# Patient Record
Sex: Female | Born: 1980
Health system: Southern US, Community
[De-identification: ages and names within clinical notes are randomized; demographics above are authoritative.]

## PROBLEM LIST (undated history)

## (undated) DIAGNOSIS — F53 Postpartum depression: Secondary | ICD-10-CM

## (undated) DIAGNOSIS — O99345 Other mental disorders complicating the puerperium: Secondary | ICD-10-CM

## (undated) DIAGNOSIS — I10 Essential (primary) hypertension: Secondary | ICD-10-CM

## (undated) DIAGNOSIS — F419 Anxiety disorder, unspecified: Secondary | ICD-10-CM

## (undated) HISTORY — PX: LEG SURGERY: SHX1003

## (undated) HISTORY — PX: OTHER SURGICAL HISTORY: SHX169

---

## 2013-05-10 ENCOUNTER — Emergency Department (HOSPITAL_BASED_OUTPATIENT_CLINIC_OR_DEPARTMENT_OTHER)
Admission: EM | Admit: 2013-05-10 | Discharge: 2013-05-10 | Disposition: A | Discharge status: Home / Self Care | Payer: BC Managed Care – PPO | Attending: Emergency Medicine | Admitting: Emergency Medicine

## 2013-05-10 ENCOUNTER — Emergency Department (HOSPITAL_BASED_OUTPATIENT_CLINIC_OR_DEPARTMENT_OTHER): Payer: BC Managed Care – PPO

## 2013-05-10 ENCOUNTER — Encounter (HOSPITAL_BASED_OUTPATIENT_CLINIC_OR_DEPARTMENT_OTHER): Payer: Self-pay | Admitting: Emergency Medicine

## 2013-05-10 DIAGNOSIS — Y9301 Activity, walking, marching and hiking: Secondary | ICD-10-CM | POA: Insufficient documentation

## 2013-05-10 DIAGNOSIS — M25552 Pain in left hip: Secondary | ICD-10-CM

## 2013-05-10 DIAGNOSIS — W19XXXA Unspecified fall, initial encounter: Secondary | ICD-10-CM

## 2013-05-10 DIAGNOSIS — W108XXA Fall (on) (from) other stairs and steps, initial encounter: Secondary | ICD-10-CM | POA: Insufficient documentation

## 2013-05-10 DIAGNOSIS — S79919A Unspecified injury of unspecified hip, initial encounter: Secondary | ICD-10-CM | POA: Insufficient documentation

## 2013-05-10 DIAGNOSIS — S3981XA Other specified injuries of abdomen, initial encounter: Secondary | ICD-10-CM | POA: Insufficient documentation

## 2013-05-10 DIAGNOSIS — Y92009 Unspecified place in unspecified non-institutional (private) residence as the place of occurrence of the external cause: Secondary | ICD-10-CM | POA: Insufficient documentation

## 2013-05-10 MED ORDER — CYCLOBENZAPRINE HCL 10 MG PO TABS: 10.0000 mg | ORAL_TABLET | Freq: Two times a day (BID) | ORAL | Status: DC | PRN

## 2013-05-10 MED ORDER — OXYCODONE-ACETAMINOPHEN 5-325 MG PO TABS: 1.0000 | ORAL_TABLET | ORAL | Status: DC | PRN

## 2013-05-10 MED ORDER — OXYCODONE-ACETAMINOPHEN 5-325 MG PO TABS
1.0000 | ORAL_TABLET | Freq: Once | ORAL | Status: AC
  Administered 2013-05-10: 1 via ORAL
  Filled 2013-05-10: qty 1

## 2013-05-10 NOTE — ED Provider Notes (Signed)
CSN: [id number]     Arrival date & time 05/10/13  1925 History   First MD Initiated Contact with Patient 05/10/13 1944     Chief Complaint  Patient presents with  . Fall   (Consider location/radiation/quality/duration/timing/severity/associated sxs/prior Treatment) Patient is a 32 y.o. female presenting with fall. The history is provided by the patient. No language interpreter was used.  Fall This is a new problem. The current episode started today. Pertinent negatives include no chills or fever. Associated symptoms comments: She slipped while walking down partially carpeted stairs, hitting her left flank causing pain. She is significantly uncomfortable with walking or moving. No abdominal pain or hematuria. No SOB. Marland Kitchen    No past medical history on file. History reviewed. No pertinent past surgical history. No family history on file. History  Substance Use Topics  . Smoking status: Never Smoker   . Smokeless tobacco: Not on file  . Alcohol Use: No   OB History   Grav Para Term Preterm Abortions TAB SAB Ect Mult Living                 Review of Systems  Constitutional: Negative for fever and chills.  HENT: Negative.   Respiratory: Negative.   Cardiovascular: Negative.   Gastrointestinal: Negative.   Genitourinary: Positive for flank pain. Negative for hematuria.  Musculoskeletal:       See HPI.  Skin: Negative.   Neurological: Negative.     Allergies  Review of patient's allergies indicates no known allergies.  Home Medications  No current outpatient prescriptions on file. BP 155/110  Pulse 120  Temp(Src) 97.8 F (36.6 C) (Oral)  Resp 18  Ht 5\' 2"  (1.575 m)  Wt 100 lb (45.36 kg)  BMI 18.29 kg/m2  SpO2 100% Physical Exam  Constitutional: She is oriented to person, place, and time. She appears well-developed and well-nourished.  Neck: Normal range of motion.  Pulmonary/Chest: Effort normal. She exhibits no tenderness.  Abdominal: Soft. There is no tenderness.  There is no rebound and no guarding.  Genitourinary:  Tender to left flank over lower ribs to iliac crest. No bruising or bony deformity. No lumbar tenderness. Full strength of left lower extremity.  Musculoskeletal: She exhibits no edema.  Neurological: She is alert and oriented to person, place, and time.  Skin: Skin is warm and dry.    ED Course  Procedures (including critical care time) Labs Review Labs Reviewed - No data to display Imaging Review No results found.  EKG Interpretation   None      Dg Pelvis 1-2 Views  05/10/2013   CLINICAL DATA:  Pain post trauma  EXAM: PELVIS - 1-2 VIEW  COMPARISON:  None.  FINDINGS: There is no fracture or dislocation. Joint spaces appear intact. No erosive change.  IMPRESSION: No abnormality noted.   Electronically Signed   By: Bretta Bang M.D.   On: 05/10/2013 20:11    MDM  No diagnosis found. 1. Fall 2. Left hip pain  Patient declined rib film, obtaining only pelvis x-ray, which is negative. Re-evaluation: No flank tenderness, abdomen remains non-tender. She is fully weight bearing on left lower extremity. Pain medication helping but pain still present.   Stable for discharge.     Arnoldo Hooker, PA-C 05/10/13 2042

## 2013-05-10 NOTE — ED Provider Notes (Signed)
Medical screening examination/treatment/procedure(s) were performed by non-physician practitioner and as supervising physician I was immediately available for consultation/collaboration.  EKG Interpretation   None         Tonee Silverstein, MD 05/10/13 2137 

## 2013-05-10 NOTE — ED Notes (Signed)
Fell down stairs at home   Injury to left hip

## 2013-05-14 ENCOUNTER — Encounter (HOSPITAL_BASED_OUTPATIENT_CLINIC_OR_DEPARTMENT_OTHER): Payer: Self-pay | Admitting: Emergency Medicine

## 2013-05-14 ENCOUNTER — Emergency Department (HOSPITAL_BASED_OUTPATIENT_CLINIC_OR_DEPARTMENT_OTHER): Payer: BC Managed Care – PPO

## 2013-05-14 ENCOUNTER — Emergency Department (HOSPITAL_BASED_OUTPATIENT_CLINIC_OR_DEPARTMENT_OTHER)
Admission: EM | Admit: 2013-05-14 | Discharge: 2013-05-14 | Disposition: A | Discharge status: Home / Self Care | Payer: BC Managed Care – PPO | Attending: Emergency Medicine | Admitting: Emergency Medicine

## 2013-05-14 DIAGNOSIS — M545 Low back pain, unspecified: Secondary | ICD-10-CM | POA: Insufficient documentation

## 2013-05-14 DIAGNOSIS — M25552 Pain in left hip: Secondary | ICD-10-CM

## 2013-05-14 DIAGNOSIS — IMO0002 Reserved for concepts with insufficient information to code with codable children: Secondary | ICD-10-CM | POA: Insufficient documentation

## 2013-05-14 DIAGNOSIS — M25559 Pain in unspecified hip: Secondary | ICD-10-CM | POA: Insufficient documentation

## 2013-05-14 DIAGNOSIS — G8911 Acute pain due to trauma: Secondary | ICD-10-CM | POA: Insufficient documentation

## 2013-05-14 DIAGNOSIS — M549 Dorsalgia, unspecified: Secondary | ICD-10-CM

## 2013-05-14 MED ORDER — OXYCODONE-ACETAMINOPHEN 5-325 MG PO TABS: 1.0000 | ORAL_TABLET | ORAL | Status: DC | PRN

## 2013-05-14 MED ORDER — PREDNISONE 20 MG PO TABS: 40.0000 mg | ORAL_TABLET | Freq: Every day | ORAL | Status: DC

## 2013-05-14 NOTE — ED Notes (Signed)
Pt c/o left low hip pain shooting down back of left leg since fall on 12/18. Pt reports pain worse with movement.

## 2013-05-14 NOTE — ED Provider Notes (Signed)
CSN: [id number]     Arrival date & time 05/14/13  1159 History   First MD Initiated Contact with Patient 05/14/13 1209     Chief Complaint  Patient presents with  . Hip Pain   (Consider location/radiation/quality/duration/timing/severity/associated sxs/prior Treatment) Patient is a 32 y.o. female presenting with hip pain. The history is provided by the patient and medical records.  Hip Pain Associated symptoms include arthralgias.   This is a 32 y.o. F with no significant PMH presenting to the ED for left hip and low back pain following a fall down carpeted stairs on 05/10/13.  No head trauma or LOC.  Pt was seen in the ED with negative pelvis x-ray.  States sx have continued and she now has some pain radiating down her left anterior and posterior thigh but does not descend past the knee. No new injuries or falls. No numbness or paresthesias.  No loss of bowel or bladder function.  Pt has been taking the prescribed pain medication as directed with only minimal improvement of sx.  Pt does not have a PCP at this time.    History reviewed. No pertinent past medical history. History reviewed. No pertinent past surgical history. No family history on file. History  Substance Use Topics  . Smoking status: Never Smoker   . Smokeless tobacco: Not on file  . Alcohol Use: No   OB History   Grav Para Term Preterm Abortions TAB SAB Ect Mult Living                 Review of Systems  Musculoskeletal: Positive for arthralgias and back pain.  All other systems reviewed and are negative.    Allergies  Review of patient's allergies indicates no known allergies.  Home Medications   Current Outpatient Rx  Name  Route  Sig  Dispense  Refill  . cyclobenzaprine (FLEXERIL) 10 MG tablet   Oral   Take 1 tablet (10 mg total) by mouth 2 (two) times daily as needed for muscle spasms.   20 tablet   0   . oxyCODONE-acetaminophen (PERCOCET/ROXICET) 5-325 MG per tablet   Oral   Take 1 tablet by mouth  every 4 (four) hours as needed for severe pain.   15 tablet   0   . oxyCODONE-acetaminophen (PERCOCET/ROXICET) 5-325 MG per tablet   Oral   Take 1 tablet by mouth every 4 (four) hours as needed.   10 tablet   0   . predniSONE (DELTASONE) 20 MG tablet   Oral   Take 2 tablets (40 mg total) by mouth daily. Take 40 mg by mouth daily for 3 days, then 20mg  by mouth daily for 3 days, then 10mg  daily for 3 days   12 tablet   0    BP 144/83  Pulse 102  Temp(Src) 98.4 F (36.9 C) (Oral)  Resp 16  SpO2 100%  Physical Exam  Nursing note and vitals reviewed. Constitutional: She is oriented to person, place, and time. She appears well-developed and well-nourished. No distress.  HENT:  Head: Normocephalic and atraumatic.  Mouth/Throat: Oropharynx is clear and moist.  Eyes: Conjunctivae and EOM are normal. Pupils are equal, round, and reactive to light.  Neck: Normal range of motion. Neck supple.  Cardiovascular: Normal rate, regular rhythm and normal heart sounds.   Pulmonary/Chest: Effort normal and breath sounds normal. No respiratory distress. She has no wheezes.  Musculoskeletal: Normal range of motion.  TTP of left posterior hip and left SI joint; no midline  step-off or deformity; limited ROM of left hip and low back due to pain; distal sensation intact; normal gait  Neurological: She is alert and oriented to person, place, and time.  Skin: Skin is warm and dry. She is not diaphoretic.  Psychiatric: She has a normal mood and affect.    ED Course  Procedures (including critical care time) Labs Review Labs Reviewed - No data to display Imaging Review Dg Lumbar Spine Complete  05/14/2013   CLINICAL DATA:  Pain post trauma  EXAM: LUMBAR SPINE - COMPLETE 4+ VIEW  COMPARISON:  None.  FINDINGS: Frontal, lateral, spot lumbosacral lateral, and bilateral oblique views were obtained. There are 5 non-rib-bearing lumbar type vertebral bodies. There is a transitional S1 vertebra. There is no  fracture or spondylolisthesis. Disk spaces appear intact. No appreciable facet arthropathy.  IMPRESSION: No fracture or spondylolisthesis.  No appreciable arthropathy.   Electronically Signed   By: Bretta Bang M.D.   On: 05/14/2013 13:02    EKG Interpretation   None       MDM   1. Back pain   2. Hip pain, left    Back and left posterior hip pain without signs/sx concerning for cauda equina.  X-ray negative for acute findings, pt remains ambulatory with some pain. Rx percocet and prednisone.  Encouraged pt to establish care with PCP-- referral and resource guide given.  Return precautions advised for new or worsening symptoms.  Garlon Hatchet, PA-C 05/14/13 [phone]

## 2013-05-15 NOTE — ED Provider Notes (Signed)
Medical screening examination/treatment/procedure(s) were performed by non-physician practitioner and as supervising physician I was immediately available for consultation/collaboration.  EKG Interpretation   None         Candyce Churn, MD 05/15/13 [phone]

## 2014-08-13 ENCOUNTER — Other Ambulatory Visit: Payer: Self-pay

## 2015-03-29 ENCOUNTER — Encounter (HOSPITAL_BASED_OUTPATIENT_CLINIC_OR_DEPARTMENT_OTHER): Payer: Self-pay | Admitting: Emergency Medicine

## 2015-03-29 ENCOUNTER — Emergency Department (HOSPITAL_BASED_OUTPATIENT_CLINIC_OR_DEPARTMENT_OTHER)
Admission: EM | Admit: 2015-03-29 | Discharge: 2015-03-29 | Disposition: A | Discharge status: Home / Self Care | Payer: BLUE CROSS/BLUE SHIELD | Attending: Physician Assistant | Admitting: Physician Assistant

## 2015-03-29 ENCOUNTER — Emergency Department (HOSPITAL_BASED_OUTPATIENT_CLINIC_OR_DEPARTMENT_OTHER): Payer: BLUE CROSS/BLUE SHIELD

## 2015-03-29 DIAGNOSIS — Y9389 Activity, other specified: Secondary | ICD-10-CM | POA: Insufficient documentation

## 2015-03-29 DIAGNOSIS — Y998 Other external cause status: Secondary | ICD-10-CM | POA: Diagnosis not present

## 2015-03-29 DIAGNOSIS — S199XXA Unspecified injury of neck, initial encounter: Secondary | ICD-10-CM | POA: Diagnosis not present

## 2015-03-29 DIAGNOSIS — I10 Essential (primary) hypertension: Secondary | ICD-10-CM | POA: Insufficient documentation

## 2015-03-29 DIAGNOSIS — M549 Dorsalgia, unspecified: Secondary | ICD-10-CM

## 2015-03-29 DIAGNOSIS — Z79899 Other long term (current) drug therapy: Secondary | ICD-10-CM | POA: Insufficient documentation

## 2015-03-29 DIAGNOSIS — Y92410 Unspecified street and highway as the place of occurrence of the external cause: Secondary | ICD-10-CM | POA: Diagnosis not present

## 2015-03-29 DIAGNOSIS — S3992XA Unspecified injury of lower back, initial encounter: Secondary | ICD-10-CM | POA: Diagnosis not present

## 2015-03-29 HISTORY — DX: Essential (primary) hypertension: I10

## 2015-03-29 MED ORDER — IBUPROFEN 800 MG PO TABS
800.0000 mg | ORAL_TABLET | Freq: Once | ORAL | Status: AC
  Administered 2015-03-29: 800 mg via ORAL
  Filled 2015-03-29: qty 1

## 2015-03-29 MED ORDER — IBUPROFEN 800 MG PO TABS: 800.0000 mg | ORAL_TABLET | Freq: Three times a day (TID) | ORAL | Status: DC

## 2015-03-29 MED ORDER — CYCLOBENZAPRINE HCL 10 MG PO TABS
10.0000 mg | ORAL_TABLET | Freq: Once | ORAL | Status: AC
  Administered 2015-03-29: 10 mg via ORAL
  Filled 2015-03-29: qty 1

## 2015-03-29 MED ORDER — CYCLOBENZAPRINE HCL 10 MG PO TABS: 10.0000 mg | ORAL_TABLET | Freq: Two times a day (BID) | ORAL | Status: DC | PRN

## 2015-03-29 NOTE — Discharge Instructions (Signed)

## 2015-03-29 NOTE — ED Notes (Signed)
Patient transported to X-ray 
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
## 2016-12-11 ENCOUNTER — Inpatient Hospital Stay (HOSPITAL_BASED_OUTPATIENT_CLINIC_OR_DEPARTMENT_OTHER)
Admission: EM | Admit: 2016-12-11 | Discharge: 2016-12-15 | DRG: 392 | Disposition: A | Discharge status: Home / Self Care | Payer: BLUE CROSS/BLUE SHIELD | Attending: Family Medicine | Admitting: Family Medicine

## 2016-12-11 ENCOUNTER — Encounter (HOSPITAL_BASED_OUTPATIENT_CLINIC_OR_DEPARTMENT_OTHER): Payer: Self-pay | Admitting: Emergency Medicine

## 2016-12-11 ENCOUNTER — Emergency Department (HOSPITAL_BASED_OUTPATIENT_CLINIC_OR_DEPARTMENT_OTHER)
Admission: EM | Admit: 2016-12-11 | Discharge: 2016-12-11 | Disposition: A | Discharge status: Home / Self Care | Payer: BLUE CROSS/BLUE SHIELD | Source: Home / Self Care | Attending: Emergency Medicine | Admitting: Emergency Medicine

## 2016-12-11 DIAGNOSIS — Z681 Body mass index (BMI) 19 or less, adult: Secondary | ICD-10-CM

## 2016-12-11 DIAGNOSIS — F419 Anxiety disorder, unspecified: Secondary | ICD-10-CM | POA: Diagnosis present

## 2016-12-11 DIAGNOSIS — I10 Essential (primary) hypertension: Secondary | ICD-10-CM | POA: Diagnosis present

## 2016-12-11 DIAGNOSIS — K529 Noninfective gastroenteritis and colitis, unspecified: Secondary | ICD-10-CM | POA: Diagnosis not present

## 2016-12-11 DIAGNOSIS — N390 Urinary tract infection, site not specified: Secondary | ICD-10-CM | POA: Insufficient documentation

## 2016-12-11 DIAGNOSIS — F329 Major depressive disorder, single episode, unspecified: Secondary | ICD-10-CM | POA: Diagnosis present

## 2016-12-11 DIAGNOSIS — E860 Dehydration: Secondary | ICD-10-CM | POA: Diagnosis present

## 2016-12-11 DIAGNOSIS — R51 Headache: Secondary | ICD-10-CM

## 2016-12-11 DIAGNOSIS — R112 Nausea with vomiting, unspecified: Secondary | ICD-10-CM

## 2016-12-11 DIAGNOSIS — R1110 Vomiting, unspecified: Secondary | ICD-10-CM

## 2016-12-11 DIAGNOSIS — R739 Hyperglycemia, unspecified: Secondary | ICD-10-CM | POA: Diagnosis present

## 2016-12-11 DIAGNOSIS — Z79899 Other long term (current) drug therapy: Secondary | ICD-10-CM

## 2016-12-11 DIAGNOSIS — E871 Hypo-osmolality and hyponatremia: Secondary | ICD-10-CM | POA: Diagnosis present

## 2016-12-11 DIAGNOSIS — R519 Headache, unspecified: Secondary | ICD-10-CM

## 2016-12-11 DIAGNOSIS — R1115 Cyclical vomiting syndrome unrelated to migraine: Secondary | ICD-10-CM

## 2016-12-11 DIAGNOSIS — R636 Underweight: Secondary | ICD-10-CM | POA: Diagnosis present

## 2016-12-11 DIAGNOSIS — E876 Hypokalemia: Secondary | ICD-10-CM | POA: Diagnosis present

## 2016-12-11 HISTORY — DX: Other mental disorders complicating the puerperium: O99.345

## 2016-12-11 HISTORY — DX: Anxiety disorder, unspecified: F41.9

## 2016-12-11 HISTORY — DX: Postpartum depression: F53.0

## 2016-12-11 LAB — URINALYSIS, ROUTINE W REFLEX MICROSCOPIC
Bilirubin Urine: NEGATIVE
Glucose, UA: 100 mg/dL — AB
HGB URINE DIPSTICK: NEGATIVE
Ketones, ur: 40 mg/dL — AB
NITRITE: NEGATIVE
PH: 7.5 (ref 5.0–8.0)
Protein, ur: 100 mg/dL — AB
Specific Gravity, Urine: 1.018 (ref 1.005–1.030)

## 2016-12-11 LAB — URINALYSIS, MICROSCOPIC (REFLEX)

## 2016-12-11 LAB — CBC WITH DIFFERENTIAL/PLATELET
Basophils Absolute: 0 10*3/uL (ref 0.0–0.1)
Basophils Relative: 0 %
Eosinophils Absolute: 0 10*3/uL (ref 0.0–0.7)
Eosinophils Relative: 0 %
HCT: 39 % (ref 36.0–46.0)
HEMOGLOBIN: 13.2 g/dL (ref 12.0–15.0)
Lymphocytes Relative: 11 %
Lymphs Abs: 0.9 10*3/uL (ref 0.7–4.0)
MCH: 31.7 pg (ref 26.0–34.0)
MCHC: 33.8 g/dL (ref 30.0–36.0)
MCV: 93.8 fL (ref 78.0–100.0)
Monocytes Absolute: 0.5 10*3/uL (ref 0.1–1.0)
Monocytes Relative: 6 %
NEUTROS PCT: 83 %
Neutro Abs: 7 10*3/uL (ref 1.7–7.7)
Platelets: 223 10*3/uL (ref 150–400)
RBC: 4.16 MIL/uL (ref 3.87–5.11)
RDW: 14.3 % (ref 11.5–15.5)
WBC: 8.4 10*3/uL (ref 4.0–10.5)

## 2016-12-11 LAB — COMPREHENSIVE METABOLIC PANEL
ALK PHOS: 79 U/L (ref 38–126)
ALT: 33 U/L (ref 14–54)
AST: 33 U/L (ref 15–41)
Albumin: 4.8 g/dL (ref 3.5–5.0)
Anion gap: 16 — ABNORMAL HIGH (ref 5–15)
BUN: 12 mg/dL (ref 6–20)
CALCIUM: 10 mg/dL (ref 8.9–10.3)
CO2: 24 mmol/L (ref 22–32)
Chloride: 100 mmol/L — ABNORMAL LOW (ref 101–111)
Creatinine, Ser: 0.84 mg/dL (ref 0.44–1.00)
GFR calc Af Amer: >60 mL/min (ref >60)
GFR calc non Af Amer: >60 mL/min (ref >60)
Glucose, Bld: 169 mg/dL — ABNORMAL HIGH (ref 65–99)
Potassium: 4 mmol/L (ref 3.5–5.1)
Sodium: 140 mmol/L (ref 135–145)
Total Bilirubin: 0.8 mg/dL (ref 0.3–1.2)
Total Protein: 8.1 g/dL (ref 6.5–8.1)

## 2016-12-11 LAB — PREGNANCY, URINE: Preg Test, Ur: NEGATIVE

## 2016-12-11 LAB — LIPASE, BLOOD: Lipase: 21 U/L (ref 11–51)

## 2016-12-11 MED ORDER — CEPHALEXIN 500 MG PO CAPS: 500.0000 mg | ORAL_CAPSULE | Freq: Three times a day (TID) | ORAL | 0 refills | Status: DC

## 2016-12-11 MED ORDER — PROCHLORPERAZINE EDISYLATE 5 MG/ML IJ SOLN
10.0000 mg | Freq: Once | INTRAMUSCULAR | Status: AC
  Administered 2016-12-11: 10 mg via INTRAVENOUS
  Filled 2016-12-11: qty 2

## 2016-12-11 MED ORDER — PROMETHAZINE HCL 25 MG/ML IJ SOLN
12.5000 mg | Freq: Once | INTRAMUSCULAR | Status: AC
  Administered 2016-12-11: 12.5 mg via INTRAVENOUS
  Filled 2016-12-11: qty 1

## 2016-12-11 MED ORDER — DEXTROSE 5 % IV SOLN
1.0000 g | Freq: Once | INTRAVENOUS | Status: AC
  Administered 2016-12-11: 1 g via INTRAVENOUS
  Filled 2016-12-11: qty 10

## 2016-12-11 MED ORDER — DIPHENHYDRAMINE HCL 50 MG/ML IJ SOLN
25.0000 mg | Freq: Once | INTRAMUSCULAR | Status: AC
  Administered 2016-12-11: 25 mg via INTRAVENOUS
  Filled 2016-12-11: qty 1

## 2016-12-11 MED ORDER — ONDANSETRON 4 MG PO TBDP: 4.0000 mg | ORAL_TABLET | Freq: Three times a day (TID) | ORAL | 0 refills | Status: AC | PRN

## 2016-12-11 MED ORDER — SODIUM CHLORIDE 0.9 % IV BOLUS (SEPSIS)
1000.0000 mL | Freq: Once | INTRAVENOUS | Status: AC
  Administered 2016-12-11: 1000 mL via INTRAVENOUS

## 2016-12-11 MED ORDER — SODIUM CHLORIDE 0.9 % IV BOLUS (SEPSIS)
500.0000 mL | Freq: Once | INTRAVENOUS | Status: AC
  Administered 2016-12-11: 500 mL via INTRAVENOUS

## 2016-12-11 MED ORDER — PROMETHAZINE HCL 25 MG RE SUPP: 25.0000 mg | Freq: Four times a day (QID) | RECTAL | 0 refills | Status: DC | PRN

## 2016-12-11 MED ORDER — LACTATED RINGERS IV BOLUS (SEPSIS)
1000.0000 mL | Freq: Once | INTRAVENOUS | Status: AC
Start: 2016-12-11 — End: 2016-12-11
  Administered 2016-12-11: 1000 mL via INTRAVENOUS

## 2016-12-11 MED ORDER — ONDANSETRON HCL 4 MG/2ML IJ SOLN
4.0000 mg | Freq: Once | INTRAMUSCULAR | Status: AC
  Administered 2016-12-11: 4 mg via INTRAVENOUS
  Filled 2016-12-11: qty 2

## 2016-12-11 NOTE — ED Notes (Addendum)
States," I feel better now, since I vomited and I am no longer nauseated " Eating saltines and drinking gingerale

## 2016-12-11 NOTE — ED Notes (Signed)
Given gingerale for fluid challenge , tol well, no vomiting but having slight nausea

## 2016-12-11 NOTE — ED Notes (Signed)
ED Provider at bedside. 

## 2016-12-11 NOTE — ED Provider Notes (Addendum)
[facility]   CSN: [id number] Arrival date & time: 12/11/16  7829     History   Chief Complaint Chief Complaint  Patient presents with  . Nausea  . Emesis    HPI Julia Baldwin is a 36 y.o. female.  HPI   36 yo F with h/o HTN, postpartum depression here with nausea and emesis. Pt states that her sx started 2 days ago with gradual onset of nausea and vomiting. No known food exposures or sick contacts. She has since had persistent, intractable nausea and vomiting. She has been unable to eat or drink due to this nausea. She tried to take zofran but immediately threw it back up. She has been "miserable" and is now lightheaded with standing. No recent med changes. Son is in day care and has 2 mo at home, but neither is sick. No associated abd pain. No bilious or bloody emesis. No diarrhea.  Past Medical History:  Diagnosis Date  . Anxiety   . Depression, postpartum   . Hypertension     There are no active problems to display for this patient.   Past Surgical History:  Procedure Laterality Date  . CESAREAN SECTION    . OTHER SURGICAL HISTORY Right    ORIF with hardware to tib/fib    OB History    No data available       Home Medications    Prior to Admission medications   Medication Sig Start Date End Date Taking? Authorizing Provider  labetalol (NORMODYNE) 100 MG tablet Take 100 mg by mouth once.   Yes [provider]  LORazepam (ATIVAN) 1 MG tablet Take 1 mg by mouth every 8 (eight) hours as needed for anxiety.   Yes [provider]  sertraline (ZOLOFT) 50 MG tablet Take 50 mg by mouth daily.   Yes [provider]  cephALEXin (KEFLEX) 500 MG capsule Take 1 capsule (500 mg total) by mouth 3 (three) times daily. 12/11/16 12/18/16  Shaune Pollack, MD  cyclobenzaprine (FLEXERIL) 10 MG tablet Take 1 tablet (10 mg total) by mouth 2 (two) times daily as needed for muscle spasms. 05/10/13   Elpidio Anis, PA-C    cyclobenzaprine (FLEXERIL) 10 MG tablet Take 1 tablet (10 mg total) by mouth 2 (two) times daily as needed for muscle spasms. 03/29/15   Mackuen, Courteney Lyn, MD  ibuprofen (ADVIL,MOTRIN) 800 MG tablet Take 1 tablet (800 mg total) by mouth 3 (three) times daily. 03/29/15   Mackuen, Courteney Lyn, MD  ondansetron (ZOFRAN ODT) 4 MG disintegrating tablet Take 1 tablet (4 mg total) by mouth every 8 (eight) hours as needed for nausea or vomiting. 12/11/16   Shaune Pollack, MD  oxyCODONE-acetaminophen (PERCOCET/ROXICET) 5-325 MG per tablet Take 1 tablet by mouth every 4 (four) hours as needed for severe pain. 05/10/13   Elpidio Anis, PA-C  oxyCODONE-acetaminophen (PERCOCET/ROXICET) 5-325 MG per tablet Take 1 tablet by mouth every 4 (four) hours as needed. 05/14/13   Garlon Hatchet, PA-C  predniSONE (DELTASONE) 20 MG tablet Take 2 tablets (40 mg total) by mouth daily. Take 40 mg by mouth daily for 3 days, then 20mg  by mouth daily for 3 days, then 10mg  daily for 3 days 05/14/13   Garlon Hatchet, PA-C  promethazine (PHENERGAN) 25 MG suppository Place 1 suppository (25 mg total) rectally every 6 (six) hours as needed for refractory nausea / vomiting. 12/11/16   Shaune Pollack, MD  verapamil (VERELAN) 100 MG 24 hr capsule Take 100 mg  by mouth at bedtime.    [provider]    Family History No family history on file.  Social History Social History  Substance Use Topics  . Smoking status: Never Smoker  . Smokeless tobacco: Not on file  . Alcohol use No     Allergies   Patient has no known allergies.   Review of Systems Review of Systems  Constitutional: Positive for fatigue.  Gastrointestinal: Positive for abdominal pain, nausea and vomiting.  Neurological: Positive for weakness.  All other systems reviewed and are negative.    Physical Exam Updated Vital Signs BP 115/72 (BP Location: Right Arm)   Pulse 89   Temp 98.3 F (36.8 C) (Oral)   Resp 18   Ht 5\' 2"  (1.575 m)    Wt 43.1 kg (95 lb)   SpO2 100%   BMI 17.38 kg/m   Physical Exam  Constitutional: She is oriented to person, place, and time. She appears well-developed and well-nourished. No distress.  HENT:  Head: Normocephalic and atraumatic.  Eyes: Conjunctivae are normal.  Neck: Neck supple.  Cardiovascular: Normal rate, regular rhythm and normal heart sounds.  Exam reveals no friction rub.   No murmur heard. Pulmonary/Chest: Effort normal and breath sounds normal. No respiratory distress. She has no wheezes. She has no rales.  Abdominal: Soft. Bowel sounds are normal. She exhibits no distension. There is no rebound and no guarding.  No significant TTP. No CVAT bilaterally. No guarding or rebound.  Musculoskeletal: She exhibits no edema.  Neurological: She is alert and oriented to person, place, and time. She exhibits normal muscle tone.  Skin: Skin is warm. Capillary refill takes less than 2 seconds.  Psychiatric: She has a normal mood and affect.  Nursing note and vitals reviewed.    ED Treatments / Results  Labs (all labs ordered are listed, but only abnormal results are displayed) Labs Reviewed  COMPREHENSIVE METABOLIC PANEL - Abnormal; Notable for the following:       Result Value   Chloride 100 (*)    Glucose, Bld 169 (*)    Anion gap 16 (*)    All other components within normal limits  URINALYSIS, ROUTINE W REFLEX MICROSCOPIC - Abnormal; Notable for the following:    APPearance CLOUDY (*)    Glucose, UA 100 (*)    Ketones, ur 40 (*)    Protein, ur 100 (*)    Leukocytes, UA MODERATE (*)    All other components within normal limits  URINALYSIS, MICROSCOPIC (REFLEX) - Abnormal; Notable for the following:    Bacteria, UA FEW (*)    Squamous Epithelial / LPF 0-5 (*)    All other components within normal limits  URINE CULTURE  CBC WITH DIFFERENTIAL/PLATELET  LIPASE, BLOOD  PREGNANCY, URINE    EKG  EKG Interpretation None       Radiology No results  found.  Procedures Procedures (including critical care time)  Medications Ordered in ED Medications  prochlorperazine (COMPAZINE) injection 10 mg (10 mg Intravenous Given 12/11/16 0932)  diphenhydrAMINE (BENADRYL) injection 25 mg (25 mg Intravenous Given 12/11/16 0932)  sodium chloride 0.9 % bolus 1,000 mL (0 mLs Intravenous Stopped 12/11/16 1025)  lactated ringers bolus 1,000 mL (0 mLs Intravenous Stopped 12/11/16 1114)  ondansetron (ZOFRAN) injection 4 mg (4 mg Intravenous Given 12/11/16 1025)  promethazine (PHENERGAN) injection 12.5 mg (12.5 mg Intravenous Given 12/11/16 1149)  cefTRIAXone (ROCEPHIN) 1 g in dextrose 5 % 50 mL IVPB (0 g Intravenous Stopped 12/11/16 1333)  sodium  chloride 0.9 % bolus 500 mL (0 mLs Intravenous Stopped 12/11/16 1333)     Initial Impression / Assessment and Plan / ED Course  I have reviewed the triage vital signs and the nursing notes.  Pertinent labs & imaging results that were available during my care of the patient were reviewed by me and considered in my medical decision making (see chart for details).     36 yo F with PMHx as above here with nausea, vomiting, and inability to eat/drink. Suspect this is 2/2 viral GI illness versus food-borne illness. Her children are currently around multiple other children with possible sick contacts. Sx began acutely and without pain. She has no RUQ TTP, normal LFTs, and I do not suspect cholecystitis, pancreatitis, or ulcer. She has mildly elevated AG likely 2/2 dehydration but normal CO2, no signs of new onset DKA or significant acidosis. CBC without leukocytosis or anemia. Pt has completely soft, NT, ND abdomen with no RLQ TTP, no s/s to suggest appendicitis or diverticulitis.  Following IVF and antiemetics, pt feels much better. She does have some mild pyuria which will treat as UTI given her vomiting, though this may be 2/2 urinary concentration. Otherwise, she feels much improved. She did vomit once more in the ED. I  discussed my concern about her ongoing vomiting and recommended admission for observation but pt is adamant that she cannot stay as she has work/things to do at home. We discussed risks/benefits of outpt management and pt declines admission. She has been given 2L fluids. She feels markedly improved and is now tolerating PO without difficulty. Repeat exam shows NO RUQ, RLQ or other TTP. Will d/c with good return precautions.  Final Clinical Impressions(s) / ED Diagnoses   Final diagnoses:  Non-intractable vomiting with nausea, unspecified vomiting type  Dehydration  Lower urinary tract infectious disease    New Prescriptions    Shaune PollackIsaacs, Sparkle Aube, MD 12/11/16 2111    Shaune PollackIsaacs, Alicyn Klann, MD 12/12/16 [phone]

## 2016-12-11 NOTE — ED Triage Notes (Signed)
Pt presents with c/o vomiting pt was seen here earlier today for the same.

## 2016-12-11 NOTE — ED Notes (Signed)
Pt unable to give UA at this time 

## 2016-12-11 NOTE — ED Triage Notes (Addendum)
N/V x 2 days, denies pain or diarrhea. Took pepto-bismal and zofran, no relief . 2 months post partum, breast feeding

## 2016-12-12 ENCOUNTER — Encounter (HOSPITAL_COMMUNITY): Payer: Self-pay | Admitting: Internal Medicine

## 2016-12-12 DIAGNOSIS — R739 Hyperglycemia, unspecified: Secondary | ICD-10-CM | POA: Diagnosis present

## 2016-12-12 DIAGNOSIS — E876 Hypokalemia: Secondary | ICD-10-CM | POA: Diagnosis present

## 2016-12-12 DIAGNOSIS — R112 Nausea with vomiting, unspecified: Secondary | ICD-10-CM | POA: Diagnosis not present

## 2016-12-12 DIAGNOSIS — K529 Noninfective gastroenteritis and colitis, unspecified: Secondary | ICD-10-CM | POA: Diagnosis present

## 2016-12-12 DIAGNOSIS — N390 Urinary tract infection, site not specified: Secondary | ICD-10-CM

## 2016-12-12 DIAGNOSIS — I10 Essential (primary) hypertension: Secondary | ICD-10-CM

## 2016-12-12 DIAGNOSIS — Z79899 Other long term (current) drug therapy: Secondary | ICD-10-CM | POA: Diagnosis not present

## 2016-12-12 DIAGNOSIS — E871 Hypo-osmolality and hyponatremia: Secondary | ICD-10-CM | POA: Diagnosis present

## 2016-12-12 DIAGNOSIS — F329 Major depressive disorder, single episode, unspecified: Secondary | ICD-10-CM | POA: Diagnosis present

## 2016-12-12 DIAGNOSIS — R636 Underweight: Secondary | ICD-10-CM | POA: Diagnosis present

## 2016-12-12 DIAGNOSIS — F419 Anxiety disorder, unspecified: Secondary | ICD-10-CM | POA: Diagnosis present

## 2016-12-12 DIAGNOSIS — R1110 Vomiting, unspecified: Secondary | ICD-10-CM | POA: Diagnosis present

## 2016-12-12 DIAGNOSIS — Z681 Body mass index (BMI) 19 or less, adult: Secondary | ICD-10-CM | POA: Diagnosis not present

## 2016-12-12 DIAGNOSIS — E860 Dehydration: Secondary | ICD-10-CM | POA: Diagnosis present

## 2016-12-12 LAB — CBC
HCT: 37.7 % (ref 36.0–46.0)
HEMOGLOBIN: 12.6 g/dL (ref 12.0–15.0)
MCH: 30.9 pg (ref 26.0–34.0)
MCHC: 33.4 g/dL (ref 30.0–36.0)
MCV: 92.4 fL (ref 78.0–100.0)
PLATELETS: 206 10*3/uL (ref 150–400)
RBC: 4.08 MIL/uL (ref 3.87–5.11)
RDW: 14.3 % (ref 11.5–15.5)
WBC: 8.7 10*3/uL (ref 4.0–10.5)

## 2016-12-12 LAB — GLUCOSE, CAPILLARY
GLUCOSE-CAPILLARY: 106 mg/dL — AB (ref 65–99)
Glucose-Capillary: 110 mg/dL — ABNORMAL HIGH (ref 65–99)
Glucose-Capillary: 116 mg/dL — ABNORMAL HIGH (ref 65–99)
Glucose-Capillary: 115 mg/dL — ABNORMAL HIGH (ref 65–99)

## 2016-12-12 LAB — CREATININE, SERUM: CREATININE: 0.81 mg/dL (ref 0.44–1.00)

## 2016-12-12 LAB — PHOSPHORUS: Phosphorus: 3.1 mg/dL (ref 2.5–4.6)

## 2016-12-12 LAB — URINALYSIS, ROUTINE W REFLEX MICROSCOPIC
Bilirubin Urine: NEGATIVE
Glucose, UA: NEGATIVE mg/dL
Hgb urine dipstick: NEGATIVE
KETONES UR: 15 mg/dL — AB
LEUKOCYTES UA: NEGATIVE
NITRITE: NEGATIVE
PH: 7.5 (ref 5.0–8.0)
Protein, ur: NEGATIVE mg/dL
SPECIFIC GRAVITY, URINE: 1.01 (ref 1.005–1.030)

## 2016-12-12 LAB — URINE CULTURE

## 2016-12-12 LAB — TSH: TSH: 0.512 u[IU]/mL (ref 0.350–4.500)

## 2016-12-12 LAB — MAGNESIUM: MAGNESIUM: 1.6 mg/dL — AB (ref 1.7–2.4)

## 2016-12-12 MED ORDER — ENOXAPARIN SODIUM 40 MG/0.4ML ~~LOC~~ SOLN
40.0000 mg | SUBCUTANEOUS | Status: DC
  Administered 2016-12-12: 40 mg via SUBCUTANEOUS
  Filled 2016-12-12: qty 0.4

## 2016-12-12 MED ORDER — DEXTROSE 5 % IV SOLN
1.0000 g | INTRAVENOUS | Status: DC
  Administered 2016-12-12 – 2016-12-13 (×2): 1 g via INTRAVENOUS
  Filled 2016-12-12 (×3): qty 10

## 2016-12-12 MED ORDER — INSULIN ASPART 100 UNIT/ML ~~LOC~~ SOLN: 0.0000 [IU] | Freq: Every day | SUBCUTANEOUS | Status: DC

## 2016-12-12 MED ORDER — BOOST / RESOURCE BREEZE PO LIQD
1.0000 | Freq: Three times a day (TID) | ORAL | Status: DC
  Administered 2016-12-12 – 2016-12-13 (×2): 1 via ORAL

## 2016-12-12 MED ORDER — PROMETHAZINE HCL 25 MG RE SUPP: 25.0000 mg | Freq: Four times a day (QID) | RECTAL | Status: DC | PRN

## 2016-12-12 MED ORDER — OXYCODONE-ACETAMINOPHEN 5-325 MG PO TABS: 1.0000 | ORAL_TABLET | ORAL | Status: DC | PRN

## 2016-12-12 MED ORDER — HYDRALAZINE HCL 20 MG/ML IJ SOLN
10.0000 mg | Freq: Four times a day (QID) | INTRAMUSCULAR | Status: DC | PRN
  Administered 2016-12-12: 10 mg via INTRAVENOUS
  Filled 2016-12-12 (×2): qty 0.5

## 2016-12-12 MED ORDER — PROMETHAZINE HCL 25 MG/ML IJ SOLN
12.5000 mg | Freq: Four times a day (QID) | INTRAMUSCULAR | Status: DC | PRN
  Administered 2016-12-12 – 2016-12-13 (×2): 12.5 mg via INTRAVENOUS
  Filled 2016-12-12 (×2): qty 1

## 2016-12-12 MED ORDER — SERTRALINE HCL 50 MG PO TABS
50.0000 mg | ORAL_TABLET | Freq: Every day | ORAL | Status: DC
  Administered 2016-12-12 – 2016-12-15 (×4): 50 mg via ORAL
  Filled 2016-12-12 (×4): qty 1

## 2016-12-12 MED ORDER — INSULIN ASPART 100 UNIT/ML ~~LOC~~ SOLN
0.0000 [IU] | Freq: Three times a day (TID) | SUBCUTANEOUS | Status: DC
  Administered 2016-12-13: 2 [IU] via SUBCUTANEOUS

## 2016-12-12 MED ORDER — CYCLOBENZAPRINE HCL 10 MG PO TABS: 10.0000 mg | ORAL_TABLET | Freq: Two times a day (BID) | ORAL | Status: DC | PRN

## 2016-12-12 MED ORDER — VERAPAMIL HCL ER 100 MG PO CP24: 100.0000 mg | ORAL_CAPSULE | Freq: Every day | ORAL | Status: DC

## 2016-12-12 MED ORDER — ONDANSETRON HCL 4 MG/2ML IJ SOLN
4.0000 mg | Freq: Four times a day (QID) | INTRAMUSCULAR | Status: DC | PRN
  Administered 2016-12-12: 4 mg via INTRAVENOUS
  Filled 2016-12-12: qty 2

## 2016-12-12 MED ORDER — LORAZEPAM 2 MG/ML IJ SOLN
1.0000 mg | Freq: Once | INTRAMUSCULAR | Status: AC
  Administered 2016-12-12: 1 mg via INTRAVENOUS
  Filled 2016-12-12: qty 1

## 2016-12-12 MED ORDER — PRENATAL MULTIVITAMIN CH
1.0000 | ORAL_TABLET | Freq: Every day | ORAL | Status: DC
  Administered 2016-12-13 – 2016-12-15 (×3): 1 via ORAL
  Filled 2016-12-12 (×4): qty 1

## 2016-12-12 MED ORDER — DOCUSATE SODIUM 100 MG PO CAPS: 100.0000 mg | ORAL_CAPSULE | Freq: Every day | ORAL | Status: DC | PRN

## 2016-12-12 MED ORDER — PROMETHAZINE HCL 25 MG/ML IJ SOLN
25.0000 mg | Freq: Once | INTRAMUSCULAR | Status: AC
  Administered 2016-12-12: 25 mg via INTRAVENOUS
  Filled 2016-12-12: qty 1

## 2016-12-12 MED ORDER — IBUPROFEN 800 MG PO TABS
800.0000 mg | ORAL_TABLET | Freq: Three times a day (TID) | ORAL | Status: DC | PRN
Start: 2016-12-12 — End: 2016-12-13
  Administered 2016-12-12: 800 mg via ORAL
  Filled 2016-12-12: qty 1

## 2016-12-12 MED ORDER — SODIUM CHLORIDE 0.9 % IV SOLN
INTRAVENOUS | Status: AC
  Administered 2016-12-12: 04:00:00 via INTRAVENOUS

## 2016-12-12 MED ORDER — PANTOPRAZOLE SODIUM 40 MG PO TBEC: 40.0000 mg | DELAYED_RELEASE_TABLET | Freq: Every day | ORAL | Status: DC | PRN

## 2016-12-12 MED ORDER — METOCLOPRAMIDE HCL 5 MG/ML IJ SOLN
5.0000 mg | Freq: Four times a day (QID) | INTRAMUSCULAR | Status: DC | PRN
  Administered 2016-12-12 (×2): 5 mg via INTRAVENOUS
  Filled 2016-12-12 (×2): qty 2

## 2016-12-12 MED ORDER — PREDNISONE 20 MG PO TABS: 40.0000 mg | ORAL_TABLET | Freq: Every day | ORAL | Status: DC

## 2016-12-12 NOTE — ED Notes (Signed)
"  feel a little better", given PO water.

## 2016-12-12 NOTE — H&P (Signed)
History and Physical    Julia AblesCatherine Musick Baldwin DOB: 1980-06-24 DOA: 12/11/2016  Referring MD/NP/PA:  PCP: Rocky Morelostand, Robert, MD   Outpatient Specialists:  Patient coming from: Home  Chief Complaint: Nausea and Vomitting  HPI: Julia Baldwin is a 36 y.o. female with medical history significant of Depression with Anxiety and Hypertension presenting with intractable nausea and vomiting with failed out-patient treatment, associated with recent UTI. She is 2 months post C/S, currently breastfeeding.  She was seen at the ED yesterday for nausea and vomiting and treated with IV fluids with antiemetics/antinauseants with improvement  and discharged home on oral  Antiemetics/antinauseants with antibiotics for presumptiove associated UTI, only to return in less than 24 hours due to recurring and worsening symptoms.  No history of abdominal pains, dysuria, fever/chills, headaches, neck pain/stiffbess.  ED Course: Pt was hemodynamically stable, and [facility] due to continued persistent symptoms despite IV antiemetics . Review of Systems: As per HPI otherwise 10 point review of systems negative.    Past Medical History:  Diagnosis Date  . Anxiety   . Depression, postpartum   . Hypertension     Past Surgical History:  Procedure Laterality Date  . CESAREAN SECTION    . OTHER SURGICAL HISTORY Right    ORIF with hardware to tib/fib     reports that she has never smoked. She does not have any smokeless tobacco history on file. She reports that she does not drink alcohol or use drugs.  No Known Allergies  History reviewed. No pertinent family history.   Prior to Admission medications   Medication Sig Start Date End Date Taking? Authorizing Provider  cephALEXin (KEFLEX) 500 MG capsule Take 1 capsule (500 mg total) by mouth 3 (three) times daily. 12/11/16 12/18/16  Shaune PollackIsaacs, Cameron, MD  cyclobenzaprine (FLEXERIL) 10 MG tablet Take 1 tablet  (10 mg total) by mouth 2 (two) times daily as needed for muscle spasms. 05/10/13   Elpidio AnisUpstill, Shari, PA-C  cyclobenzaprine (FLEXERIL) 10 MG tablet Take 1 tablet (10 mg total) by mouth 2 (two) times daily as needed for muscle spasms. 03/29/15   Mackuen, Courteney Lyn, MD  ibuprofen (ADVIL,MOTRIN) 800 MG tablet Take 1 tablet (800 mg total) by mouth 3 (three) times daily. 03/29/15   Mackuen, Courteney Lyn, MD  labetalol (NORMODYNE) 100 MG tablet Take 100 mg by mouth once.    [provider]  LORazepam (ATIVAN) 1 MG tablet Take 1 mg by mouth every 8 (eight) hours as needed for anxiety.    [provider]  ondansetron (ZOFRAN ODT) 4 MG disintegrating tablet Take 1 tablet (4 mg total) by mouth every 8 (eight) hours as needed for nausea or vomiting. 12/11/16   Shaune PollackIsaacs, Cameron, MD  oxyCODONE-acetaminophen (PERCOCET/ROXICET) 5-325 MG per tablet Take 1 tablet by mouth every 4 (four) hours as needed for severe pain. 05/10/13   Elpidio AnisUpstill, Shari, PA-C  oxyCODONE-acetaminophen (PERCOCET/ROXICET) 5-325 MG per tablet Take 1 tablet by mouth every 4 (four) hours as needed. 05/14/13   Garlon HatchetSanders, Lisa M, PA-C  predniSONE (DELTASONE) 20 MG tablet Take 2 tablets (40 mg total) by mouth daily. Take 40 mg by mouth daily for 3 days, then 20mg  by mouth daily for 3 days, then 10mg  daily for 3 days 05/14/13   Garlon HatchetSanders, Lisa M, PA-C  promethazine (PHENERGAN) 25 MG suppository Place 1 suppository (25 mg total) rectally every 6 (six) hours as needed for refractory nausea / vomiting. 12/11/16   Shaune PollackIsaacs, Cameron, MD  sertraline (ZOLOFT)  50 MG tablet Take 50 mg by mouth daily.    [provider]  verapamil (VERELAN) 100 MG 24 hr capsule Take 100 mg by mouth at bedtime.    [provider]    Physical Exam: Vitals:   12/12/16 1610 12/12/16 9604 12/12/16 0649 12/12/16 0811  BP:  (!) 160/100 (!) 166/102 (!) 167/101  Pulse:  71  74  Resp:  15  20  Temp: 99.9 F (37.7 C) 98.7 F (37.1 C)  98.9 F (37.2 C)    TempSrc: Oral Oral  Oral  SpO2:  98%  97%      Constitutional: NAD, calm, comfortable Vitals:   12/12/16 0527 12/12/16 0632 12/12/16 0649 12/12/16 0811  BP:  (!) 160/100 (!) 166/102 (!) 167/101  Pulse:  71  74  Resp:  15  20  Temp: 99.9 F (37.7 C) 98.7 F (37.1 C)  98.9 F (37.2 C)  TempSrc: Oral Oral  Oral  SpO2:  98%  97%   Eyes: PERRL, lids and conjunctivae normal ENMT: Mucous membranes are moist. Posterior pharynx clear of any exudate or lesions.Normal dentition.  Neck: normal, supple, no masses, no thyromegaly Respiratory: clear to auscultation bilaterally, no wheezing, no crackles. Normal respiratory effort. No accessory muscle use.  Cardiovascular: Regular rate and rhythm, no murmurs / rubs / gallops. No extremity edema. 2+ pedal pulses. No carotid bruits.  Abdomen: no tenderness, no masses palpated. No hepatosplenomegaly. Bowel sounds positive.  Musculoskeletal: no clubbing / cyanosis. No joint deformity upper and lower extremities. Good ROM, no contractures. Normal muscle tone.  Skin: no rashes, lesions, ulcers. No induration Neurologic: Alert, O x 3, no focal deficit Psychiatric: Normal judgment and insight.    Labs on Admission: I have personally reviewed following labs and imaging studies  CBC:  Recent Labs Lab 12/11/16 0922 12/12/16 0841  WBC 8.4 8.7  NEUTROABS 7.0  --   HGB 13.2 12.6  HCT 39.0 37.7  MCV 93.8 92.4  PLT 223 206   Basic Metabolic Panel:  Recent Labs Lab 12/11/16 0922  NA 140  K 4.0  CL 100*  CO2 24  GLUCOSE 169*  BUN 12  CREATININE 0.84  CALCIUM 10.0   GFR: Estimated Creatinine Clearance: 62.4 mL/min (by C-G formula based on SCr of 0.84 mg/dL). Liver Function Tests:  Recent Labs Lab 12/11/16 0922  AST 33  ALT 33  ALKPHOS 79  BILITOT 0.8  PROT 8.1  ALBUMIN 4.8    Recent Labs Lab 12/11/16 0922  LIPASE 21   No results for input(s): AMMONIA in the last 168 hours. Coagulation Profile: No results for input(s):  INR, PROTIME in the last 168 hours. Cardiac Enzymes: No results for input(s): CKTOTAL, CKMB, CKMBINDEX, TROPONINI in the last 168 hours. BNP (last 3 results) No results for input(s): PROBNP in the last 8760 hours. HbA1C: No results for input(s): HGBA1C in the last 72 hours. CBG:  Recent Labs Lab 12/12/16 0850  GLUCAP 110*   Lipid Profile: No results for input(s): CHOL, HDL, LDLCALC, TRIG, CHOLHDL, LDLDIRECT in the last 72 hours. Thyroid Function Tests: No results for input(s): TSH, T4TOTAL, FREET4, T3FREE, THYROIDAB in the last 72 hours. Anemia Panel: No results for input(s): VITAMINB12, FOLATE, FERRITIN, TIBC, IRON, RETICCTPCT in the last 72 hours. Urine analysis:    Component Value Date/Time   COLORURINE YELLOW 12/12/2016 0115   APPEARANCEUR CLEAR 12/12/2016 0115   LABSPEC 1.010 12/12/2016 0115   PHURINE 7.5 12/12/2016 0115   GLUCOSEU NEGATIVE 12/12/2016 0115  HGBUR NEGATIVE 12/12/2016 0115   BILIRUBINUR NEGATIVE 12/12/2016 0115   KETONESUR 15 (A) 12/12/2016 0115   PROTEINUR NEGATIVE 12/12/2016 0115   NITRITE NEGATIVE 12/12/2016 0115   LEUKOCYTESUR NEGATIVE 12/12/2016 0115   Sepsis Labs: @LABRCNTIP (procalcitonin:4,lacticidven:4) )No results found for this or any previous visit (from the past 240 hour(s)).   Radiological Exams on Admission: No results found.    Assessment/Plan Principal Problem:   Intractable nausea and vomiting Active Problems:   UTI (urinary tract infection)   Essential hypertension   Hyperglycemia  #1 Intractable Nausea and Vomitting: Etiology unclear-??UTI IV antinauseants/Antiemetics IVF Monitor I/O IV Rocephin to cover ? UTI Check Ucx Monitor electroltyes/renal function Supportive Care  #2 HTN: Uncontrolled - prn IV hydralazine for now  #3 Hyperglycemia: No prior h/o DM Monitor POCT CBG SSI coverage as needed Check A1C  DVTprophylaxis:(Lovenox) Code Status:  (Full) Family Communication:   Disposition Plan:  Home Consults called:  Admission status:  ( obs / medical floor)   OSEI-BONSU,Olyver Hawes MD [facility] Pager [phone]  If 7PM-7AM, please contact night-coverage [email or web address] Password [email or web address]  12/12/2016, 9:15 AM

## 2016-12-12 NOTE — Progress Notes (Signed)
[facility] Patient is ambulatory without assistance, alert and oriented x4. Patient states N/V for 3 days and unable to hold anything down. Reports she is breastfeeding her newborn who is 782 months old who was delivered via C Section. Patient has right AC SL in place. Oriented to room, call bell, bed use, etc. [facility] admits service called and spoke with Bjorn Loserhonda who states day shift provider will see patient but if needed before to call Dr. Antionette Charpyd. Will c/t monitor.

## 2016-12-12 NOTE — ED Notes (Signed)
Remains nauseated, tolerating ginger ale, "have not vomited".

## 2016-12-12 NOTE — ED Notes (Signed)
Attempted report 

## 2016-12-12 NOTE — ED Notes (Signed)
Up to b/r, steady gait 

## 2016-12-12 NOTE — Progress Notes (Signed)
Ms. Julia Baldwin has hx of anxiety and depression, is ~2 mos s/p Cesarean section, [facility] 2 days of nausea and vomiting. Denies abd pain, is afebrile, labs essentially normal. Seen yesterday for same, treated for ?UTI, and discharged home with Zofran and phenergan suppository. Has continued to have constant nausea, vomiting. Abd exam reportedly benign.   Discussed with ED physician, Dr. Read DriversMolpus, and [facility] under observation status.

## 2016-12-12 NOTE — ED Notes (Signed)
Alert, NAD, calm, interactive, resps e/u, speaking in clear complete sentences, no dyspnea noted, skin W&D, c/o continued nv, tried eating at home, meds not helping.

## 2016-12-12 NOTE — ED Provider Notes (Signed)
[facility] Provider Note: Julia DellJ. Lane Graeson Nouri, MD, FACEP  CSN: [id number] MRN: [id number] ARRIVAL: 12/11/16 at 2120 ROOM: MH03/MH03   CHIEF COMPLAINT  Vomiting   HISTORY OF PRESENT ILLNESS  Julia Baldwin is a 36 y.o. female with a two-day history of nausea and vomiting. She was seen here yesterday and treated with IV fluids (NS and LR), Compazine, diphenhydramine, Zofran and Phenergan. She was also given Rocephin for a urinary tract infection. She states there was discussion initially about having her admitted for intractable nausea and vomiting but she improved and went home. Despite using Zofran and Phenergan suppository at home her nausea and vomiting returned. It is currently bilious and in small amounts because she has had little oral intake. She denies associated abdominal pain, fever, chills or diarrhea. She denies marijuana use.  She was given one liter of normal saline prior to my evaluation.  Past Medical History:  Diagnosis Date  . Anxiety   . Depression, postpartum   . Hypertension     Past Surgical History:  Procedure Laterality Date  . CESAREAN SECTION    . 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  Social History  Substance Use Topics  . Smoking status: Never Smoker  . Smokeless tobacco: Not on file  . Alcohol use No    Prior to Admission medications   Medication Sig Start Date End Date Taking? Authorizing Provider  cephALEXin (KEFLEX) 500 MG capsule Take 1 capsule (500 mg total) by mouth 3 (three) times daily. 12/11/16 12/18/16  Shaune PollackIsaacs, Cameron, MD  cyclobenzaprine (FLEXERIL) 10 MG tablet Take 1 tablet (10 mg total) by mouth 2 (two) times daily as needed for muscle spasms. 05/10/13   Elpidio AnisUpstill, Shari, PA-C  cyclobenzaprine (FLEXERIL) 10 MG tablet Take 1 tablet (10 mg total) by mouth 2 (two) times daily as needed for muscle spasms. 03/29/15   Mackuen, Courteney Lyn, MD  ibuprofen (ADVIL,MOTRIN) 800  MG tablet Take 1 tablet (800 mg total) by mouth 3 (three) times daily. 03/29/15   Mackuen, Courteney Lyn, MD  labetalol (NORMODYNE) 100 MG tablet Take 100 mg by mouth once.    [provider]  LORazepam (ATIVAN) 1 MG tablet Take 1 mg by mouth every 8 (eight) hours as needed for anxiety.    [provider]  ondansetron (ZOFRAN ODT) 4 MG disintegrating tablet Take 1 tablet (4 mg total) by mouth every 8 (eight) hours as needed for nausea or vomiting. 12/11/16   Shaune PollackIsaacs, Cameron, MD  oxyCODONE-acetaminophen (PERCOCET/ROXICET) 5-325 MG per tablet Take 1 tablet by mouth every 4 (four) hours as needed for severe pain. 05/10/13   Elpidio AnisUpstill, Shari, PA-C  oxyCODONE-acetaminophen (PERCOCET/ROXICET) 5-325 MG per tablet Take 1 tablet by mouth every 4 (four) hours as needed. 05/14/13   Garlon HatchetSanders, Lisa M, PA-C  predniSONE (DELTASONE) 20 MG tablet Take 2 tablets (40 mg total) by mouth daily. Take 40 mg by mouth daily for 3 days, then 20mg  by mouth daily for 3 days, then 10mg  daily for 3 days 05/14/13   Garlon HatchetSanders, Lisa M, PA-C  promethazine (PHENERGAN) 25 MG suppository Place 1 suppository (25 mg total) rectally every 6 (six) hours as needed for refractory nausea / vomiting. 12/11/16   Shaune PollackIsaacs, Cameron, MD  sertraline (ZOLOFT) 50 MG tablet Take 50 mg by mouth daily.    [provider]  verapamil (VERELAN) 100 MG 24 hr capsule Take 100 mg by mouth at bedtime.  [provider]    Allergies Patient has no known allergies.   REVIEW OF SYSTEMS  Negative except as noted here or in the History of Present Illness.   PHYSICAL EXAMINATION  Initial Vital Signs Blood pressure 121/64, pulse 74, temperature 99 F (37.2 C), temperature source Oral, resp. rate 20, SpO2 100 %.  Examination General: Well-developed, well-nourished female in no acute distress; appearance consistent with age of record HENT: normocephalic; atraumatic Eyes: pupils equal, round and reactive to light; extraocular  muscles intact Neck: supple Heart: regular rate and rhythm Lungs: clear to auscultation bilaterally Abdomen: soft; nondistended; mild epigastric tenderness; no masses or hepatosplenomegaly; bowel sounds present Extremities: No deformity; full range of motion; pulses normal Neurologic: Awake, alert and oriented; motor function intact in all extremities and symmetric; no facial droop Skin: Warm and dry Psychiatric: Normal mood and affect   RESULTS  Summary of this visit's results, reviewed by myself:   EKG Interpretation  Date/Time:    Ventricular Rate:    PR Interval:    QRS Duration:   QT Interval:    QTC Calculation:   R Axis:     Text Interpretation:        Laboratory Studies: Results for orders placed or performed during the hospital encounter of 12/11/16 (from the past 24 hour(s))  Urinalysis, Routine w reflex microscopic     Status: Abnormal   Collection Time: 12/12/16  1:15 AM  Result Value Ref Range   Color, Urine YELLOW YELLOW   APPearance CLEAR CLEAR   Specific Gravity, Urine 1.010 1.005 - 1.030   pH 7.5 5.0 - 8.0   Glucose, UA NEGATIVE NEGATIVE mg/dL   Hgb urine dipstick NEGATIVE NEGATIVE   Bilirubin Urine NEGATIVE NEGATIVE   Ketones, ur 15 (A) NEGATIVE mg/dL   Protein, ur NEGATIVE NEGATIVE mg/dL   Nitrite NEGATIVE NEGATIVE   Leukocytes, UA NEGATIVE NEGATIVE   Imaging Studies: No results found.  ED COURSE  Nursing notes and initial vitals signs, including pulse oximetry, reviewed.  Vitals:   12/11/16 2136  BP: 121/64  Pulse: 74  Resp: 20  Temp: 99 F (37.2 C)  TempSrc: Oral  SpO2: 100%   3:46 AM Nausea not controlled despite Phenergan 25 milligrams IV. Patient is still vomiting small amounts of bile.  4:00 AM Dr. Antionette Char to admit to [facility] hospitalist service.   PROCEDURES    ED DIAGNOSES     ICD-10-CM   1. Persistent vomiting in adult patient R11.10        Julia Libra, MD 12/12/16 [phone]

## 2016-12-12 NOTE — ED Notes (Signed)
Up to b/r, steady gait, NAD, calm, "feel a little better".

## 2016-12-13 ENCOUNTER — Inpatient Hospital Stay (HOSPITAL_COMMUNITY): Payer: BLUE CROSS/BLUE SHIELD

## 2016-12-13 ENCOUNTER — Encounter (HOSPITAL_COMMUNITY): Payer: Self-pay | Admitting: Radiology

## 2016-12-13 LAB — BASIC METABOLIC PANEL
Anion gap: 13 (ref 5–15)
BUN: 9 mg/dL (ref 6–20)
CALCIUM: 8.7 mg/dL — AB (ref 8.9–10.3)
CHLORIDE: 94 mmol/L — AB (ref 101–111)
CO2: 25 mmol/L (ref 22–32)
CREATININE: 0.79 mg/dL (ref 0.44–1.00)
GFR calc Af Amer: >60 mL/min (ref >60)
GFR calc non Af Amer: >60 mL/min (ref >60)
Glucose, Bld: 131 mg/dL — ABNORMAL HIGH (ref 65–99)
Potassium: 2.8 mmol/L — ABNORMAL LOW (ref 3.5–5.1)
SODIUM: 132 mmol/L — AB (ref 135–145)

## 2016-12-13 LAB — CBC
HCT: 39.5 % (ref 36.0–46.0)
Hemoglobin: 13.4 g/dL (ref 12.0–15.0)
MCH: 31.3 pg (ref 26.0–34.0)
MCHC: 33.9 g/dL (ref 30.0–36.0)
MCV: 92.3 fL (ref 78.0–100.0)
PLATELETS: 202 10*3/uL (ref 150–400)
RBC: 4.28 MIL/uL (ref 3.87–5.11)
RDW: 13.9 % (ref 11.5–15.5)
WBC: 8 10*3/uL (ref 4.0–10.5)

## 2016-12-13 LAB — URINE CULTURE: CULTURE: NO GROWTH

## 2016-12-13 LAB — GLUCOSE, CAPILLARY
GLUCOSE-CAPILLARY: 165 mg/dL — AB (ref 65–99)
GLUCOSE-CAPILLARY: 144 mg/dL — AB (ref 65–99)
Glucose-Capillary: 131 mg/dL — ABNORMAL HIGH (ref 65–99)
Glucose-Capillary: 107 mg/dL — ABNORMAL HIGH (ref 65–99)

## 2016-12-13 MED ORDER — POTASSIUM CHLORIDE 10 MEQ/100ML IV SOLN
10.0000 meq | INTRAVENOUS | Status: AC
  Administered 2016-12-13 (×4): 10 meq via INTRAVENOUS
  Filled 2016-12-13 (×4): qty 100

## 2016-12-13 MED ORDER — MAGNESIUM SULFATE 2 GM/50ML IV SOLN
2.0000 g | Freq: Once | INTRAVENOUS | Status: AC
  Administered 2016-12-13: 2 g via INTRAVENOUS
  Filled 2016-12-13: qty 50

## 2016-12-13 MED ORDER — KCL IN DEXTROSE-NACL 20-5-0.9 MEQ/L-%-% IV SOLN
INTRAVENOUS | Status: DC
  Administered 2016-12-13 – 2016-12-14 (×3): via INTRAVENOUS
  Filled 2016-12-13 (×3): qty 1000

## 2016-12-13 MED ORDER — HYDRALAZINE HCL 20 MG/ML IJ SOLN
10.0000 mg | Freq: Once | INTRAMUSCULAR | Status: AC
  Administered 2016-12-13: 10 mg via INTRAVENOUS
  Filled 2016-12-13: qty 0.5

## 2016-12-13 MED ORDER — ACETAMINOPHEN 325 MG PO TABS
650.0000 mg | ORAL_TABLET | Freq: Four times a day (QID) | ORAL | Status: DC | PRN
  Administered 2016-12-13 – 2016-12-15 (×3): 650 mg via ORAL
  Filled 2016-12-13 (×3): qty 2

## 2016-12-13 MED ORDER — UNJURY CHICKEN SOUP POWDER
8.0000 [oz_av] | Freq: Two times a day (BID) | ORAL | Status: DC
  Administered 2016-12-13: 8 [oz_av] via ORAL
  Filled 2016-12-13 (×6): qty 27

## 2016-12-13 MED ORDER — HYDRALAZINE HCL 20 MG/ML IJ SOLN
10.0000 mg | Freq: Four times a day (QID) | INTRAMUSCULAR | Status: DC | PRN
  Administered 2016-12-13 (×2): 10 mg via INTRAVENOUS
  Filled 2016-12-13: qty 0.5

## 2016-12-13 MED ORDER — AMLODIPINE BESYLATE 5 MG PO TABS
5.0000 mg | ORAL_TABLET | Freq: Every day | ORAL | Status: DC
  Administered 2016-12-13 – 2016-12-15 (×3): 5 mg via ORAL
  Filled 2016-12-13 (×3): qty 1

## 2016-12-13 MED ORDER — METOPROLOL TARTRATE 5 MG/5ML IV SOLN
5.0000 mg | Freq: Three times a day (TID) | INTRAVENOUS | Status: DC
  Administered 2016-12-13: 5 mg via INTRAVENOUS
  Filled 2016-12-13: qty 5

## 2016-12-13 MED ORDER — SENNOSIDES-DOCUSATE SODIUM 8.6-50 MG PO TABS
1.0000 | ORAL_TABLET | Freq: Two times a day (BID) | ORAL | Status: DC
  Administered 2016-12-13 – 2016-12-15 (×5): 1 via ORAL
  Filled 2016-12-13 (×5): qty 1

## 2016-12-13 MED ORDER — TRAMADOL HCL 50 MG PO TABS
50.0000 mg | ORAL_TABLET | Freq: Three times a day (TID) | ORAL | Status: DC | PRN
  Administered 2016-12-13 (×2): 50 mg via ORAL
  Filled 2016-12-13 (×2): qty 1

## 2016-12-13 MED ORDER — LABETALOL HCL 100 MG PO TABS
100.0000 mg | ORAL_TABLET | Freq: Every day | ORAL | Status: DC
  Administered 2016-12-14 – 2016-12-15 (×2): 100 mg via ORAL
  Filled 2016-12-13 (×2): qty 1

## 2016-12-13 MED ORDER — POTASSIUM CHLORIDE 2 MEQ/ML IV SOLN: INTRAVENOUS | Status: DC

## 2016-12-13 MED ORDER — LORAZEPAM 1 MG PO TABS
1.0000 mg | ORAL_TABLET | Freq: Three times a day (TID) | ORAL | Status: DC | PRN
  Administered 2016-12-13 – 2016-12-15 (×4): 1 mg via ORAL
  Filled 2016-12-13 (×4): qty 1

## 2016-12-13 MED ORDER — ENOXAPARIN SODIUM 30 MG/0.3ML ~~LOC~~ SOLN
30.0000 mg | SUBCUTANEOUS | Status: DC
  Administered 2016-12-13 – 2016-12-15 (×3): 30 mg via SUBCUTANEOUS
  Filled 2016-12-13 (×3): qty 0.3

## 2016-12-13 MED ORDER — PROMETHAZINE HCL 25 MG/ML IJ SOLN: 12.5000 mg | Freq: Four times a day (QID) | INTRAMUSCULAR | Status: DC | PRN

## 2016-12-13 MED ORDER — PROMETHAZINE HCL 25 MG/ML IJ SOLN
12.5000 mg | Freq: Four times a day (QID) | INTRAMUSCULAR | Status: DC | PRN
  Administered 2016-12-13 – 2016-12-14 (×3): 12.5 mg via INTRAVENOUS
  Filled 2016-12-13 (×3): qty 1

## 2016-12-13 NOTE — Progress Notes (Signed)
Initial Nutrition Assessment  DOCUMENTATION CODES:   Underweight  INTERVENTION:   -D/c Boost Breeze -Provide trial of Unjury Chicken soup supplement, each provides 100 kcal and 21g protein -Once diet advanced, recommend Ensure Enlive po BID, each supplement provides 350 kcal and 20 grams of protein  RD will continue to monitor  NUTRITION DIAGNOSIS:   Increased nutrient needs related to  (lactating/currently breastfeeding) as evidenced by estimated needs.  GOAL:   Patient will meet greater than or equal to 90% of their needs  MONITOR:   PO intake, Supplement acceptance, Labs, Weight trends, I & O's  REASON FOR ASSESSMENT:   Malnutrition Screening Tool    ASSESSMENT:   36 y.o. female with medical history significant of Depression with Anxiety and Hypertension presenting with intractable nausea and vomiting with failed out-patient treatment, associated with recent UTI. She is 2 months post C/S, currently breastfeeding.  Patient in room with no family at bedside. Pt reports starting Friday, 7/20 she began to have N/V that has continued. Pt tried bland foods such as rice and toast and was unable to tolerated. Pt currently reports her nausea is controlled right now and has tolerated broth and some jello. Pt states she tried the [employer]ost Breeze and vomited afterwards. Will d/c Boost Breeze. Will trial Unjury Chicken soup with patient.  Pt currently breastfeeding her 402 month old baby. Pt states she is unsure if she will be able to continue after discharge. States she was drinking Ensure for extra protein. Will resume once diet is advanced.  Pt states that she loses a lot of weight when she breastfeeds. Nutrition focused physical exam shows no sign of depletion of muscle mass or body fat.  Medications: Prenatal MVI tablet daily, D5 and .9% NaCl w/ KCl infusion at 100 ml/hr -provides 408 kcal, Phenergan PRN Labs reviewed: CBGs: 165 Low Na, K, Mg Phos WNL  Diet Order:  Diet clear  liquid Room service appropriate? Yes; Fluid consistency: Thin  Skin:  Reviewed, no issues  Last BM:  7/21  Height:   Ht Readings from Last 1 Encounters:  12/11/16 5\' 2"  (1.575 m)    Weight:   Wt Readings from Last 1 Encounters:  12/11/16 95 lb (43.1 kg)    Ideal Body Weight:  50 kg  BMI:  17.4 kg/m^2  Estimated Nutritional Needs:   Kcal:  1600-1800  Protein:  60-70g  Fluid:  1.8L/day  EDUCATION NEEDS:   Education needs addressed  Tilda FrancoLindsey Jashanti Clinkscale, MS, RD, LDN Pager: [phone] After Hours Pager: [phone]

## 2016-12-13 NOTE — Care Management Note (Signed)
Case Management Note  Patient Details  Name: Julia AblesCatherine Baldwin MRN: [id number] Date of Birth: 1980-09-26  Subjective/Objective:    Intractable vomiting and failed outpt treatment                Action/Plan: Date:  December 13 2016  Chart reviewed for concurrent status and case management needs.  Will continue to follow patient progress.  Discharge Planning: following for needs  Expected discharge date: [id number]  Marcelle SmilingRhonda Maebell Lyvers, BSN, [place]RN3, [place]CCM   [phone]   Expected Discharge Date:                  Expected Discharge Plan:  Home/Self Care  In-House Referral:     Discharge planning Services  CM Consult  Post Acute Care Choice:    Choice offered to:     DME Arranged:    DME Agency:     HH Arranged:    HH Agency:     Status of Service:  In process, will continue to follow  If discussed at Long Length of Stay Meetings, dates discussed:    Additional Comments:  [name], Julia Nieblas Lynn, RN 12/13/2016, 9:54 AM

## 2016-12-13 NOTE — Progress Notes (Signed)
PROGRESS NOTE    Julia AblesCatherine Baldwin  ZOX:[id number] DOB: 05-28-1980 DOA: 12/11/2016 PCP: Rocky Morelostand, Robert, MD    Brief Narrative: Julia AblesCatherine Baldwin is a 36 y.o. female with medical history significant of Depression with Anxiety and Hypertension presenting with intractable nausea and vomiting with failed out-patient treatment, associated with recent UTI. She is 2 months post C/S, currently breastfeeding.  She was seen at the ED yesterday for nausea and vomiting and treated with IV fluids with antiemetics/antinauseants with improvement  and discharged home on oral  Antiemetics/antinauseants with antibiotics for presumptiove associated UTI, only to return in less than 24 hours due to recurring and worsening symptoms.  No history of abdominal pains, dysuria, fever/chills, headaches, neck pain/stiffbess.  Assessment & Plan:   Principal Problem:   Intractable nausea and vomiting Active Problems:   UTI (urinary tract infection)   Essential hypertension   Hyperglycemia  1-Nausea, vomiting;  ? UTI, UA done on 7-21 had 6-30 WBC. Urine culture insignificant growth/ she has some flank tenderness. Will get US.  If negative US will consider discontinue antibiotics.  Lipase, NL, LFT normal.  She has some associated headaches , will get CT head.  No abdominal pain doubt gastritis.  Also viral gastroenteritis is in the differential.  IV fluids, PRN antiemetic.  KUB.   2-Hypokalemia; replete IV and add KCL to IV fluids.   3-Hyperglycemia; mildly elevated on admission. HB A1c was ordered.   4-HTN; she takes labetalol at home , will schedule IV metoprolol. I will also order PRN hydralazine.   5-Hyponatremia; IV fluids.  6-depression; anxiety;  Resume ativan PRN. Zoloft.    DVT prophylaxis: lovenox Code Status: full code.  Family Communication: care discussed with patient  Disposition Plan: remain inpatient.    Consultants:   none   Procedures:    none   Antimicrobials:  ceftriaxone   Subjective: Patient report having nausea, vomiting since Friday, not getting better, unable to keep anything down.  She denies abdominal pain, last BM more than 3 days ago. She doesn't know if she is passing gas.  After she vomits she gets headaches. Denies vision changes, photophobia.   Objective: Vitals:   12/12/16 1356 12/12/16 1735 12/12/16 2001 12/13/16 0450  BP: (!) 153/88 (!) 160/100 (!) 158/102 (!) 167/96  Pulse: 72 77 79 87  Resp: 20  19 20   Temp:   99 F (37.2 C) 99.3 F (37.4 C)  TempSrc:   Oral Oral  SpO2: 96% 97% 98% 97%    Intake/Output Summary (Last 24 hours) at 12/13/16 1356 Last data filed at 12/13/16 0656  Gross per 24 hour  Intake               50 ml  Output             1000 ml  Net             -950 ml   There were no vitals filed for this visit.  Examination:  General exam: Appears calm and comfortable  Respiratory system: Clear to auscultation. Respiratory effort normal. Cardiovascular system: S1 & S2 heard, RRR. No JVD, murmurs, rubs, gallops or clicks. No pedal edema. Gastrointestinal system: Abdomen is nondistended, soft and nontender. No organomegaly or masses felt. Normal bowel sounds heard. Central nervous system: Alert and oriented. No focal neurological deficits. Extremities: Symmetric 5 x 5 power. Skin: No rashes, lesions or ulcers Psychiatry: Judgement and insight appear normal. Mood & affect appropriate.     Data Reviewed: I have personally reviewed  following labs and imaging studies  CBC:  Recent Labs Lab 12/11/16 0922 12/12/16 0841 12/13/16 0516  WBC 8.4 8.7 8.0  NEUTROABS 7.0  --   --   HGB 13.2 12.6 13.4  HCT 39.0 37.7 39.5  MCV 93.8 92.4 92.3  PLT 223 206 202   Basic Metabolic Panel:  Recent Labs Lab 12/11/16 0922 12/12/16 0841 12/13/16 0516  NA 140  --  132*  K 4.0  --  2.8*  CL 100*  --  94*  CO2 24  --  25  GLUCOSE 169*  --  131*  BUN 12  --  9  CREATININE  0.84 0.81 0.79  CALCIUM 10.0  --  8.7*  MG  --  1.6*  --   PHOS  --  3.1  --    GFR: Estimated Creatinine Clearance: 65.5 mL/min (by C-G formula based on SCr of 0.79 mg/dL). Liver Function Tests:  Recent Labs Lab 12/11/16 0922  AST 33  ALT 33  ALKPHOS 79  BILITOT 0.8  PROT 8.1  ALBUMIN 4.8    Recent Labs Lab 12/11/16 0922  LIPASE 21   No results for input(s): AMMONIA in the last 168 hours. Coagulation Profile: No results for input(s): INR, PROTIME in the last 168 hours. Cardiac Enzymes: No results for input(s): CKTOTAL, CKMB, CKMBINDEX, TROPONINI in the last 168 hours. BNP (last 3 results) No results for input(s): PROBNP in the last 8760 hours. HbA1C: No results for input(s): HGBA1C in the last 72 hours. CBG:  Recent Labs Lab 12/12/16 1156 12/12/16 1647 12/12/16 1959 12/13/16 0738 12/13/16 1140  GLUCAP 116* 115* 106* 165* 131*   Lipid Profile: No results for input(s): CHOL, HDL, LDLCALC, TRIG, CHOLHDL, LDLDIRECT in the last 72 hours. Thyroid Function Tests:  Recent Labs  12/12/16 0841  TSH 0.512   Anemia Panel: No results for input(s): VITAMINB12, FOLATE, FERRITIN, TIBC, IRON, RETICCTPCT in the last 72 hours. Sepsis Labs: No results for input(s): PROCALCITON, LATICACIDVEN in the last 168 hours.  Recent Results (from the past 240 hour(s))  Urine culture     Status: Abnormal   Collection Time: 12/11/16 11:10 AM  Result Value Ref Range Status   Specimen Description URINE, RANDOM  Final   Special Requests NONE  Final   Culture (A)  Final    <10,000 COLONIES/mL INSIGNIFICANT GROWTH Performed at [facility], [address] [place], [place] [address]    Report Status 12/12/2016 FINAL  Final         Radiology Studies: Dg Abd 1 View  Result Date: 12/13/2016 CLINICAL DATA:  Nausea and vomiting. EXAM: ABDOMEN - 1 VIEW COMPARISON:  None FINDINGS: The bowel gas pattern is normal. Small right renal calculus is identified, similar to previous  exam. IMPRESSION: 1. Nonobstructive bowel gas pattern. 2. Right renal calculus. Electronically Signed   By: Signa Kell M.D.   On: 12/13/2016 11:15        Scheduled Meds: . enoxaparin (LOVENOX) injection  30 mg Subcutaneous Q24H  . insulin aspart  0-5 Units Subcutaneous QHS  . metoprolol tartrate  5 mg Intravenous Q8H  . prenatal multivitamin  1 tablet Oral Q1200  . protein supplement  8 oz Oral BID  . senna-docusate  1 tablet Oral BID  . sertraline  50 mg Oral Daily   Continuous Infusions: . cefTRIAXone (ROCEPHIN)  IV Stopped (12/13/16 1057)  . dextrose 5 % and 0.9 % NaCl with KCl 20 mEq/L 100 mL/hr at 12/13/16 0816  .  potassium chloride 10 mEq (12/13/16 1301)     LOS: 1 day    Time spent: 35 minutes.     Alba Cory, MD [facility] Pager [phone]  If 7PM-7AM, please contact night-coverage [email or web address] Password [email or web address] 12/13/2016, 1:56 PM

## 2016-12-14 LAB — BASIC METABOLIC PANEL
ANION GAP: 8 (ref 5–15)
BUN: <5 mg/dL — ABNORMAL LOW (ref 6–20)
CALCIUM: 8.4 mg/dL — AB (ref 8.9–10.3)
CHLORIDE: 105 mmol/L (ref 101–111)
CO2: 24 mmol/L (ref 22–32)
CREATININE: 0.67 mg/dL (ref 0.44–1.00)
GFR calc non Af Amer: >60 mL/min (ref >60)
Glucose, Bld: 127 mg/dL — ABNORMAL HIGH (ref 65–99)
Potassium: 3.5 mmol/L (ref 3.5–5.1)
SODIUM: 137 mmol/L (ref 135–145)

## 2016-12-14 LAB — CBC
HCT: 38.5 % (ref 36.0–46.0)
Hemoglobin: 13.1 g/dL (ref 12.0–15.0)
MCH: 31.3 pg (ref 26.0–34.0)
MCHC: 34 g/dL (ref 30.0–36.0)
MCV: 92.1 fL (ref 78.0–100.0)
PLATELETS: 175 10*3/uL (ref 150–400)
RBC: 4.18 MIL/uL (ref 3.87–5.11)
RDW: 14 % (ref 11.5–15.5)
WBC: 6.7 10*3/uL (ref 4.0–10.5)

## 2016-12-14 LAB — GLUCOSE, CAPILLARY
GLUCOSE-CAPILLARY: 115 mg/dL — AB (ref 65–99)
GLUCOSE-CAPILLARY: 118 mg/dL — AB (ref 65–99)
Glucose-Capillary: 133 mg/dL — ABNORMAL HIGH (ref 65–99)
Glucose-Capillary: 172 mg/dL — ABNORMAL HIGH (ref 65–99)

## 2016-12-14 LAB — HEMOGLOBIN A1C
HEMOGLOBIN A1C: 4.9 % (ref 4.8–5.6)
MEAN PLASMA GLUCOSE: 94 mg/dL

## 2016-12-14 LAB — MAGNESIUM: MAGNESIUM: 1.9 mg/dL (ref 1.7–2.4)

## 2016-12-14 MED ORDER — ENSURE ENLIVE PO LIQD
237.0000 mL | Freq: Two times a day (BID) | ORAL | Status: DC
  Administered 2016-12-14 – 2016-12-15 (×2): 237 mL via ORAL

## 2016-12-14 MED ORDER — TRAMADOL HCL 50 MG PO TABS: 50.0000 mg | ORAL_TABLET | Freq: Three times a day (TID) | ORAL | Status: DC | PRN

## 2016-12-14 MED ORDER — SODIUM CHLORIDE 0.9 % IV SOLN
INTRAVENOUS | Status: DC
  Administered 2016-12-14 – 2016-12-15 (×2): via INTRAVENOUS

## 2016-12-14 NOTE — Progress Notes (Signed)
PROGRESS NOTE    Julia Baldwin  [id number]:[id number] DOB: 12/11/1980 DOA: 12/11/2016 PCP: Rocky Morel, MD    Brief Narrative: Julia Baldwin is a 36 y.o. female with medical history significant of Depression with Anxiety and Hypertension presenting with intractable nausea and vomiting with failed out-patient treatment, associated with recent UTI. She is 2 months post C/S, currently breastfeeding.  She was seen at the ED yesterday for nausea and vomiting and treated with IV fluids with antiemetics/antinauseants with improvement  and discharged home on oral  Antiemetics/antinauseants with antibiotics for presumptiove associated UTI, only to return in less than 24 hours due to recurring and worsening symptoms.  No history of abdominal pains, dysuria, fever/chills, headaches, neck pain/stiffbess.  Assessment & Plan:   Principal Problem:   Intractable nausea and vomiting Active Problems:   UTI (urinary tract infection)   Essential hypertension   Hyperglycemia  1-Nausea, vomiting; improved. Advance diet to bland diet today  ? UTI, UA done on 7-21 had 6-30 WBC. Urine culture insignificant growth/ she has some flank tenderness. Renal US;  If negative [place] will consider discontinue antibiotics.  Lipase, NL, LFT normal.  She has some associated headaches. CT head normal.  No abdominal pain doubt gastritis.  Also viral gastroenteritis is in the differential.  IV fluids, PRN antiemetic.  KUB negative for obstruction.   2-Hypokalemia; resolved.   3-Hyperglycemia; mildly elevated on admission. HB A1c; 4.9  4-HTN; Continue with labetalol and I have started Norvasc.  5-Hyponatremia; IV fluids.  6-depression; anxiety;  Resume ativan PRN. Zoloft.    DVT prophylaxis: lovenox Code Status: full code.  Family Communication: care discussed with patient  Disposition Plan: remain inpatient.    Consultants:   none   Procedures:    none   Antimicrobials:  ceftriaxone   Subjective: She is  Feeling better, tolerating clears. She would like to advanced diet.   Objective: Vitals:   12/13/16 1436 12/13/16 2130 12/14/16 0642 12/14/16 1421  BP: (!) 162/103 (!) 150/100 (!) 141/83 (!) 141/95  Pulse: 74 95 92 85  Resp: 20 20 18 18   Temp: 99.2 F (37.3 C) 99.4 F (37.4 C) 98.7 F (37.1 C) 99 F (37.2 C)  TempSrc: Oral Oral Oral Oral  SpO2: 99% 99% 99% 98%    Intake/Output Summary (Last 24 hours) at 12/14/16 1512 Last data filed at 12/14/16 1422  Gross per 24 hour  Intake              612 ml  Output                0 ml  Net              612 ml   There were no vitals filed for this visit.  Examination:  General exam: Anxious.  Respiratory system: CTA, Normal respiratory effort.  Cardiovascular system: S , S 2 RRR Gastrointestinal system: BS present, soft, nt Central nervous system: non focal.  Extremities: symmetric power.  Skin: No rashes, lesions or ulcers Psychiatry: Anxious.    Data Reviewed: I have personally reviewed following labs and imaging studies  CBC:  Recent Labs Lab 12/11/16 0922 12/12/16 0841 12/13/16 0516 12/14/16 0748  WBC 8.4 8.7 8.0 6.7  NEUTROABS 7.0  --   --   --   HGB 13.2 12.6 13.4 13.1  HCT 39.0 37.7 39.5 38.5  MCV 93.8 92.4 92.3 92.1  PLT 223 206 202 175   Basic Metabolic Panel:  Recent Labs Lab 12/11/16 0922 12/12/16 0841 12/13/16  5784 12/14/16 0748  NA 140  --  132* 137  K 4.0  --  2.8* 3.5  CL 100*  --  94* 105  CO2 24  --  25 24  GLUCOSE 169*  --  131* 127*  BUN 12  --  9 <5*  CREATININE 0.84 0.81 0.79 0.67  CALCIUM 10.0  --  8.7* 8.4*  MG  --  1.6*  --  1.9  PHOS  --  3.1  --   --    GFR: Estimated Creatinine Clearance: 65.5 mL/min (by C-G formula based on SCr of 0.67 mg/dL). Liver Function Tests:  Recent Labs Lab 12/11/16 0922  AST 33  ALT 33  ALKPHOS 79  BILITOT 0.8  PROT 8.1  ALBUMIN 4.8    Recent Labs Lab 12/11/16 0922   LIPASE 21   No results for input(s): AMMONIA in the last 168 hours. Coagulation Profile: No results for input(s): INR, PROTIME in the last 168 hours. Cardiac Enzymes: No results for input(s): CKTOTAL, CKMB, CKMBINDEX, TROPONINI in the last 168 hours. BNP (last 3 results) No results for input(s): PROBNP in the last 8760 hours. HbA1C:  Recent Labs  12/13/16 0516  HGBA1C 4.9   CBG:  Recent Labs Lab 12/13/16 1140 12/13/16 1653 12/13/16 2146 12/14/16 0720 12/14/16 1127  GLUCAP 131* 107* 144* 133* 172*   Lipid Profile: No results for input(s): CHOL, HDL, LDLCALC, TRIG, CHOLHDL, LDLDIRECT in the last 72 hours. Thyroid Function Tests:  Recent Labs  12/12/16 0841  TSH 0.512   Anemia Panel: No results for input(s): VITAMINB12, FOLATE, FERRITIN, TIBC, IRON, RETICCTPCT in the last 72 hours. Sepsis Labs: No results for input(s): PROCALCITON, LATICACIDVEN in the last 168 hours.  Recent Results (from the past 240 hour(s))  Urine culture     Status: Abnormal   Collection Time: 12/11/16 11:10 AM  Result Value Ref Range Status   Specimen Description URINE, RANDOM  Final   Special Requests NONE  Final   Culture (A)  Final    <10,000 COLONIES/mL INSIGNIFICANT GROWTH Performed at [facility], [address], [place], [place] [address]    Report Status 12/12/2016 FINAL  Final  Urine culture     Status: None   Collection Time: 12/12/16 11:16 AM  Result Value Ref Range Status   Specimen Description URINE, CLEAN CATCH  Final   Special Requests NONE  Final   Culture   Final    NO GROWTH Performed at [facility], [address], [place], [place] [address]    Report Status 12/13/2016 FINAL  Final         Radiology Studies: Dg Abd 1 View  Result Date: 12/13/2016 CLINICAL DATA:  Nausea and vomiting. EXAM: ABDOMEN - 1 VIEW COMPARISON:  None FINDINGS: The bowel gas pattern is normal. Small right renal calculus is identified, similar to previous exam. IMPRESSION:  1. Nonobstructive bowel gas pattern. 2. Right renal calculus. Electronically Signed   By: Signa Kell M.D.   On: 12/13/2016 11:15   Ct Head Wo Contrast  Result Date: 12/13/2016 CLINICAL DATA:  Vomiting and headaches for several days, initial encounter EXAM: CT HEAD WITHOUT CONTRAST TECHNIQUE: Contiguous axial images were obtained from the base of the skull through the vertex without intravenous contrast. COMPARISON:  None. FINDINGS: Brain: No evidence of acute infarction, hemorrhage, hydrocephalus, extra-axial collection or mass lesion/mass effect. Vascular: No hyperdense vessel or unexpected calcification. Skull: Normal. Negative for fracture or focal lesion. Sinuses/Orbits: No acute finding.  Other: None. IMPRESSION: No acute intracranial abnormality noted. Electronically Signed   By: Alcide CleverMark  Lukens M.D.   On: 12/13/2016 16:36   [place] Renal  Result Date: 12/13/2016 CLINICAL DATA:  Vomiting and flank tenderness EXAM: RENAL / URINARY TRACT ULTRASOUND COMPLETE COMPARISON:  None. FINDINGS: Right Kidney: Length: 9.8 cm. Echogenicity within normal limits. No mass or hydronephrosis visualized. Left Kidney: Length: 9.2 cm. Echogenicity within normal limits. No mass or hydronephrosis visualized. Bladder: Appears normal for degree of bladder distention. Bilateral ureteral jets identified. IMPRESSION: Negative. Electronically Signed   By: Corlis Leak  Hassell M.D.   On: 12/13/2016 21:37        Scheduled Meds: . amLODipine  5 mg Oral Daily  . enoxaparin (LOVENOX) injection  30 mg Subcutaneous Q24H  . feeding supplement (ENSURE ENLIVE)  237 mL Oral BID BM  . insulin aspart  0-5 Units Subcutaneous QHS  . labetalol  100 mg Oral Daily  . prenatal multivitamin  1 tablet Oral Q1200  . protein supplement  8 oz Oral BID  . senna-docusate  1 tablet Oral BID  . sertraline  50 mg Oral Daily   Continuous Infusions: . sodium chloride       LOS: 2 days    Time spent: 35 minutes.     Alba Coryegalado, Rox Mcgriff A, MD [facility] Pager [phone]  If 7PM-7AM, please contact night-coverage [email or web address] Password [email or web address] 12/14/2016, 3:12 PM

## 2016-12-15 DIAGNOSIS — I10 Essential (primary) hypertension: Secondary | ICD-10-CM

## 2016-12-15 DIAGNOSIS — R112 Nausea with vomiting, unspecified: Secondary | ICD-10-CM

## 2016-12-15 LAB — CBC
HCT: 37.1 % (ref 36.0–46.0)
HEMOGLOBIN: 12.2 g/dL (ref 12.0–15.0)
MCH: 30.5 pg (ref 26.0–34.0)
MCHC: 32.9 g/dL (ref 30.0–36.0)
MCV: 92.8 fL (ref 78.0–100.0)
PLATELETS: 160 10*3/uL (ref 150–400)
RBC: 4 MIL/uL (ref 3.87–5.11)
RDW: 14 % (ref 11.5–15.5)
WBC: 7.1 10*3/uL (ref 4.0–10.5)

## 2016-12-15 LAB — GLUCOSE, CAPILLARY
GLUCOSE-CAPILLARY: 128 mg/dL — AB (ref 65–99)
GLUCOSE-CAPILLARY: 148 mg/dL — AB (ref 65–99)

## 2016-12-15 LAB — BASIC METABOLIC PANEL
ANION GAP: 9 (ref 5–15)
BUN: 8 mg/dL (ref 6–20)
CHLORIDE: 101 mmol/L (ref 101–111)
CO2: 26 mmol/L (ref 22–32)
Calcium: 8.8 mg/dL — ABNORMAL LOW (ref 8.9–10.3)
Creatinine, Ser: 0.7 mg/dL (ref 0.44–1.00)
Glucose, Bld: 156 mg/dL — ABNORMAL HIGH (ref 65–99)
Potassium: 3.3 mmol/L — ABNORMAL LOW (ref 3.5–5.1)
SODIUM: 136 mmol/L (ref 135–145)

## 2016-12-15 MED ORDER — AMLODIPINE BESYLATE 5 MG PO TABS: 5.0000 mg | ORAL_TABLET | Freq: Every day | ORAL | 0 refills | Status: DC

## 2016-12-15 MED ORDER — LABETALOL HCL 100 MG PO TABS: 100.0000 mg | ORAL_TABLET | Freq: Two times a day (BID) | ORAL | Status: DC

## 2016-12-15 MED ORDER — POTASSIUM CHLORIDE CRYS ER 20 MEQ PO TBCR
40.0000 meq | EXTENDED_RELEASE_TABLET | ORAL | Status: AC
  Administered 2016-12-15 (×2): 40 meq via ORAL
  Filled 2016-12-15 (×2): qty 2

## 2016-12-15 MED ORDER — LABETALOL HCL 100 MG PO TABS: 100.0000 mg | ORAL_TABLET | Freq: Two times a day (BID) | ORAL | 0 refills | Status: AC

## 2016-12-15 NOTE — Progress Notes (Signed)
Pts IV removed with a clean and dry dressing intact. Pt denies pain at the time of discharge with no s/s of distress noted. Pt taken to the front entrance via wheelchair with nursing staff and family present. 

## 2016-12-15 NOTE — Discharge Summary (Signed)
Physician Discharge Summary  Julia AblesCatherine Galambos  WUJ:[id number]  DOB: October 26, 1980  DOA: 12/11/2016 PCP: Rocky Morelostand, Robert, MD  Admit date: 12/11/2016 Discharge date: 12/15/2016  Admitted From: Home  Disposition:  Home   Recommendations for Outpatient Follow-up:  1. Follow up with PCP in 1 week  2. Please obtain BMP to monitor potassium   Discharge Condition: Stable   CODE STATUS: Full  Diet recommendation: Heart Healthy   Brief/Interim Summary: 36 year old female with medical history significant for depression, anxiety and hypertension presented with intractable nausea and vomiting with failing outpatient treatment associated with recent UTI. One day prior to admission she was seen in the ED and treated with IV fluids and antiemetics subsequently was discharged on home medication with antibiotic for presumptive UTI. Patient returned to the hospital 24 hour after symptoms worsen. Patient was admitted for IV fluids nothing by mouth further workup. Workup was essentially negative. Patient was treated with IV fluids with clinical improvement subsequently her diet was advanced and she tolerated it well. Now patient clinically stable and will be discharged home to follow-up with her primary care doctor. During hospital stay blood pressure was slight above goal, labetalol was adjusted and patient was started on Norvasc.   Subjective: Patient seen and examined on the day of discharge. She remembers feeling much better now tolerating diet well. Denies nausea, vomiting, diarrhea and abdominal pain. Patient is afebrile. No acute events overnight.  Discharge Diagnoses/Hospital Course:  Gastroenteritis - cause clinically undetermined - Nausea, vomiting - improved.  ? UTI, UA done on 7-21 had 6-30 WBC. Urine culture no growth Renal US was negative - Ceftriaxone d/ced  KUB negative for obstruction Lipase, NL, LFT normal.  She has some associated headaches. CT head normal.  No abdominal pain doubt  gastritis.  Tolerating diet well.   Hypokalemia; due to GI losses  Treated with oral medication  Repeat in 1 week    Hyperglycemia; mildly elevated on admission. HB A1c; 4.9  HTN - BP above goal during hospital stay  Labetalol was increased to 100 mg BID  Norvasc 5 mg was added  Patient is to follow up with PCP for BP monitoring    Hyponatremia; resolved with IVF   Depression; anxiety;  Zoloft was continued with no changes in regimen  On the day of the discharge the patient's vitals were stable, and no other acute medical condition were reported by patient. Patient was felt safe to be discharge to home.   Discharge Instructions  You were cared for by a hospitalist during your hospital stay. If you have any questions about your discharge medications or the care you received while you were in the hospital after you are discharged, you can call the unit and asked to speak with the hospitalist on call if the hospitalist that took care of you is not available. Once you are discharged, your primary care physician will handle any further medical issues. Please note that NO REFILLS for any discharge medications will be authorized once you are discharged, as it is imperative that you return to your primary care physician (or establish a relationship with a primary care physician if you do not have one) for your aftercare needs so that they can reassess your need for medications and monitor your lab values.  Discharge Instructions    Call MD for:  difficulty breathing, headache or visual disturbances    Complete by:  As directed    Call MD for:  extreme fatigue    Complete by:  As  directed    Call MD for:  hives    Complete by:  As directed    Call MD for:  persistant dizziness or light-headedness    Complete by:  As directed    Call MD for:  persistant nausea and vomiting    Complete by:  As directed    Call MD for:  redness, tenderness, or signs of infection (pain, swelling, redness,  odor or green/yellow discharge around incision site)    Complete by:  As directed    Call MD for:  severe uncontrolled pain    Complete by:  As directed    Call MD for:  temperature >100.4    Complete by:  As directed    Diet - low sodium heart healthy    Complete by:  As directed    Increase activity slowly    Complete by:  As directed      Allergies as of 12/15/2016   No Known Allergies     Medication List    STOP taking these medications   cephALEXin 500 MG capsule Commonly known as:  KEFLEX   cyclobenzaprine 10 MG tablet Commonly known as:  FLEXERIL   ibuprofen 800 MG tablet Commonly known as:  ADVIL,MOTRIN   oxyCODONE-acetaminophen 5-325 MG tablet Commonly known as:  PERCOCET/ROXICET   predniSONE 20 MG tablet Commonly known as:  DELTASONE     TAKE these medications   amLODipine 5 MG tablet Commonly known as:  NORVASC Take 1 tablet (5 mg total) by mouth daily.   docusate sodium 100 MG capsule Commonly known as:  COLACE Take 100 mg by mouth daily as needed.   labetalol 100 MG tablet Commonly known as:  NORMODYNE Take 1 tablet (100 mg total) by mouth 2 (two) times daily. What changed:  when to take this   LORazepam 1 MG tablet Commonly known as:  ATIVAN Take 1 mg by mouth every 8 (eight) hours as needed for anxiety.   omeprazole 20 MG capsule Commonly known as:  PRILOSEC Take 20 mg by mouth daily as needed.   ondansetron 4 MG disintegrating tablet Commonly known as:  ZOFRAN ODT Take 1 tablet (4 mg total) by mouth every 8 (eight) hours as needed for nausea or vomiting.   prenatal multivitamin Tabs tablet Take 1 tablet by mouth daily at 12 noon.   promethazine 25 MG suppository Commonly known as:  PHENERGAN Place 1 suppository (25 mg total) rectally every 6 (six) hours as needed for refractory nausea / vomiting.   sertraline 50 MG tablet Commonly known as:  ZOLOFT Take 50 mg by mouth daily.      Follow-up Information    Rocky Morelostand, Robert, MD.  Schedule an appointment as soon as possible for a visit in 1 week(s).   Specialty:  Internal Medicine Why:  Hospital follow up  Contact information: [address] [place] [id number] [phone]          No Known Allergies  Consultations:  None   Procedures/Studies: Dg Abd 1 View  Result Date: 12/13/2016 CLINICAL DATA:  Nausea and vomiting. EXAM: ABDOMEN - 1 VIEW COMPARISON:  None FINDINGS: The bowel gas pattern is normal. Small right renal calculus is identified, similar to previous exam. IMPRESSION: 1. Nonobstructive bowel gas pattern. 2. Right renal calculus. Electronically Signed   By: Signa Kellaylor  Stroud M.D.   On: 12/13/2016 11:15   Ct Head Wo Contrast  Result Date: 12/13/2016 CLINICAL DATA:  Vomiting and headaches for several days, initial  encounter EXAM: CT HEAD WITHOUT CONTRAST TECHNIQUE: Contiguous axial images were obtained from the base of the skull through the vertex without intravenous contrast. COMPARISON:  None. FINDINGS: Brain: No evidence of acute infarction, hemorrhage, hydrocephalus, extra-axial collection or mass lesion/mass effect. Vascular: No hyperdense vessel or unexpected calcification. Skull: Normal. Negative for fracture or focal lesion. Sinuses/Orbits: No acute finding. Other: None. IMPRESSION: No acute intracranial abnormality noted. Electronically Signed   By: Alcide Clever M.D.   On: 12/13/2016 16:36   US Renal  Result Date: 12/13/2016 CLINICAL DATA:  Vomiting and flank tenderness EXAM: RENAL / URINARY TRACT ULTRASOUND COMPLETE COMPARISON:  None. FINDINGS: Right Kidney: Length: 9.8 cm. Echogenicity within normal limits. No mass or hydronephrosis visualized. Left Kidney: Length: 9.2 cm. Echogenicity within normal limits. No mass or hydronephrosis visualized. Bladder: Appears normal for degree of bladder distention. Bilateral ureteral jets identified. IMPRESSION: Negative. Electronically Signed   By: Corlis Leak M.D.   On: 12/13/2016 21:37     Discharge Exam: Vitals:   12/15/16 0445 12/15/16 1319  BP: (!) 150/83 135/87  Pulse: 80 80  Resp: 18 16  Temp: 98.6 F (37 C) 99 F (37.2 C)   Vitals:   12/14/16 2126 12/15/16 0339 12/15/16 0445 12/15/16 1319  BP: (!) 156/94  (!) 150/83 135/87  Pulse: 87  80 80  Resp: 16  18 16   Temp: 100 F (37.8 C) 98.4 F (36.9 C) 98.6 F (37 C) 99 F (37.2 C)  TempSrc: Oral Oral Oral Oral  SpO2: 97%  100% 98%    General: Pt is alert, awake, not in acute distress Cardiovascular: RRR, S1/S2 +, no rubs, no gallops Respiratory: CTA bilaterally, no wheezing, no rhonchi Abdominal: Soft, NT, ND, bowel sounds + Extremities: no edema, no cyanosis  The results of significant diagnostics from this hospitalization (including imaging, microbiology, ancillary and laboratory) are listed below for reference.     Microbiology: Recent Results (from the past 240 hour(s))  Urine culture     Status: Abnormal   Collection Time: 12/11/16 11:10 AM  Result Value Ref Range Status   Specimen Description URINE, RANDOM  Final   Special Requests NONE  Final   Culture (A)  Final    <10,000 COLONIES/mL INSIGNIFICANT GROWTH Performed at [facility], [address], [place], [place] [address]    Report Status 12/12/2016 FINAL  Final  Urine culture     Status: None   Collection Time: 12/12/16 11:16 AM  Result Value Ref Range Status   Specimen Description URINE, CLEAN CATCH  Final   Special Requests NONE  Final   Culture   Final    NO GROWTH Performed at [facility], [address], [place], [place] [address]    Report Status 12/13/2016 FINAL  Final     Labs: BNP (last 3 results) No results for input(s): BNP in the last 8760 hours. Basic Metabolic Panel:  Recent Labs Lab 12/11/16 0922 12/12/16 0841 12/13/16 0516 12/14/16 0748 12/15/16 0615  NA 140  --  132* 137 136  K 4.0  --  2.8* 3.5 3.3*  CL 100*  --  94* 105 101  CO2 24  --  25 24 26   GLUCOSE 169*  --  131* 127* 156*   BUN 12  --  9 <5* 8  CREATININE 0.84 0.81 0.79 0.67 0.70  CALCIUM 10.0  --  8.7* 8.4* 8.8*  MG  --  1.6*  --  1.9  --   PHOS  --  3.1  --   --   --    Liver Function Tests:  Recent Labs Lab 12/11/16 0922  AST 33  ALT 33  ALKPHOS 79  BILITOT 0.8  PROT 8.1  ALBUMIN 4.8    Recent Labs Lab 12/11/16 0922  LIPASE 21   No results for input(s): AMMONIA in the last 168 hours. CBC:  Recent Labs Lab 12/11/16 0922 12/12/16 0841 12/13/16 0516 12/14/16 0748 12/15/16 0615  WBC 8.4 8.7 8.0 6.7 7.1  NEUTROABS 7.0  --   --   --   --   HGB 13.2 12.6 13.4 13.1 12.2  HCT 39.0 37.7 39.5 38.5 37.1  MCV 93.8 92.4 92.3 92.1 92.8  PLT 223 206 202 175 160   Cardiac Enzymes: No results for input(s): CKTOTAL, CKMB, CKMBINDEX, TROPONINI in the last 168 hours. BNP: Invalid input(s): POCBNP CBG:  Recent Labs Lab 12/14/16 1127 12/14/16 1730 12/14/16 2129 12/15/16 0728 12/15/16 1127  GLUCAP 172* 115* 118* 128* 148*   D-Dimer No results for input(s): DDIMER in the last 72 hours. Hgb A1c  Recent Labs  12/13/16 0516  HGBA1C 4.9   Lipid Profile No results for input(s): CHOL, HDL, LDLCALC, TRIG, CHOLHDL, LDLDIRECT in the last 72 hours. Thyroid function studies No results for input(s): TSH, T4TOTAL, T3FREE, THYROIDAB in the last 72 hours.  Invalid input(s): FREET3 Anemia work up No results for input(s): VITAMINB12, FOLATE, FERRITIN, TIBC, IRON, RETICCTPCT in the last 72 hours. Urinalysis    Component Value Date/Time   COLORURINE YELLOW 12/12/2016 0115   APPEARANCEUR CLEAR 12/12/2016 0115   LABSPEC 1.010 12/12/2016 0115   PHURINE 7.5 12/12/2016 0115   GLUCOSEU NEGATIVE 12/12/2016 0115   HGBUR NEGATIVE 12/12/2016 0115   BILIRUBINUR NEGATIVE 12/12/2016 0115   KETONESUR 15 (A) 12/12/2016 0115   PROTEINUR NEGATIVE 12/12/2016 0115   NITRITE NEGATIVE 12/12/2016 0115   LEUKOCYTESUR NEGATIVE 12/12/2016 0115   Sepsis Labs Invalid input(s): PROCALCITONIN,  WBC,   LACTICIDVEN Microbiology Recent Results (from the past 240 hour(s))  Urine culture     Status: Abnormal   Collection Time: 12/11/16 11:10 AM  Result Value Ref Range Status   Specimen Description URINE, RANDOM  Final   Special Requests NONE  Final   Culture (A)  Final    <10,000 COLONIES/mL INSIGNIFICANT GROWTH Performed at [facility], [address] [place], [place] [address]    Report Status 12/12/2016 FINAL  Final  Urine culture     Status: None   Collection Time: 12/12/16 11:16 AM  Result Value Ref Range Status   Specimen Description URINE, CLEAN CATCH  Final   Special Requests NONE  Final   Culture   Final    NO GROWTH Performed at [facility], [address] [place], [place] [address]    Report Status 12/13/2016 FINAL  Final    Time coordinating discharge: 35 minutes  SIGNED:  Latrelle Dodrill, MD  [facility] 12/15/2016, 2:00 PM  Pager please text page via  [email or web address] Password [email or web address]

## 2016-12-15 NOTE — Progress Notes (Signed)
Date: December 15, 2016 Chart reviewed for discharge orders: None found for case management. Rhonda Davis,BSN,RN3, CCM/[phone] 

## 2016-12-23 LAB — HIV ANTIBODY (ROUTINE TESTING W REFLEX): HIV SCREEN 4TH GENERATION: NONREACTIVE

## 2017-01-21 ENCOUNTER — Emergency Department (HOSPITAL_BASED_OUTPATIENT_CLINIC_OR_DEPARTMENT_OTHER)
Admission: EM | Admit: 2017-01-21 | Discharge: 2017-01-21 | Disposition: A | Discharge status: Home / Self Care | Payer: BLUE CROSS/BLUE SHIELD | Attending: Emergency Medicine | Admitting: Emergency Medicine

## 2017-01-21 ENCOUNTER — Encounter (HOSPITAL_BASED_OUTPATIENT_CLINIC_OR_DEPARTMENT_OTHER): Payer: Self-pay | Admitting: *Deleted

## 2017-01-21 DIAGNOSIS — R1030 Lower abdominal pain, unspecified: Secondary | ICD-10-CM | POA: Diagnosis not present

## 2017-01-21 DIAGNOSIS — I10 Essential (primary) hypertension: Secondary | ICD-10-CM | POA: Diagnosis not present

## 2017-01-21 DIAGNOSIS — Z79899 Other long term (current) drug therapy: Secondary | ICD-10-CM | POA: Diagnosis not present

## 2017-01-21 DIAGNOSIS — R112 Nausea with vomiting, unspecified: Secondary | ICD-10-CM | POA: Diagnosis not present

## 2017-01-21 DIAGNOSIS — F53 Puerperal psychosis: Secondary | ICD-10-CM | POA: Insufficient documentation

## 2017-01-21 LAB — COMPREHENSIVE METABOLIC PANEL
ALBUMIN: 5 g/dL (ref 3.5–5.0)
ALK PHOS: 88 U/L (ref 38–126)
ALT: 44 U/L (ref 14–54)
ANION GAP: 14 (ref 5–15)
AST: 39 U/L (ref 15–41)
BUN: 19 mg/dL (ref 6–20)
CALCIUM: 9.5 mg/dL (ref 8.9–10.3)
CO2: 24 mmol/L (ref 22–32)
Chloride: 100 mmol/L — ABNORMAL LOW (ref 101–111)
Creatinine, Ser: 0.91 mg/dL (ref 0.44–1.00)
GFR calc Af Amer: >60 mL/min (ref >60)
GFR calc non Af Amer: >60 mL/min (ref >60)
GLUCOSE: 123 mg/dL — AB (ref 65–99)
POTASSIUM: 3.5 mmol/L (ref 3.5–5.1)
SODIUM: 138 mmol/L (ref 135–145)
Total Bilirubin: 0.8 mg/dL (ref 0.3–1.2)
Total Protein: 8.2 g/dL — ABNORMAL HIGH (ref 6.5–8.1)

## 2017-01-21 LAB — CBC WITH DIFFERENTIAL/PLATELET
BASOS ABS: 0 10*3/uL (ref 0.0–0.1)
Basophils Relative: 0 %
Eosinophils Absolute: 0 10*3/uL (ref 0.0–0.7)
Eosinophils Relative: 0 %
HEMATOCRIT: 42.5 % (ref 36.0–46.0)
HEMOGLOBIN: 14 g/dL (ref 12.0–15.0)
LYMPHS PCT: 11 %
Lymphs Abs: 0.9 10*3/uL (ref 0.7–4.0)
MCH: 31.2 pg (ref 26.0–34.0)
MCHC: 32.9 g/dL (ref 30.0–36.0)
MCV: 94.7 fL (ref 78.0–100.0)
MONO ABS: 0.7 10*3/uL (ref 0.1–1.0)
Monocytes Relative: 8 %
NEUTROS ABS: 7.1 10*3/uL (ref 1.7–7.7)
NEUTROS PCT: 81 %
Platelets: 219 10*3/uL (ref 150–400)
RBC: 4.49 MIL/uL (ref 3.87–5.11)
RDW: 15.4 % (ref 11.5–15.5)
WBC: 8.8 10*3/uL (ref 4.0–10.5)

## 2017-01-21 LAB — URINALYSIS, ROUTINE W REFLEX MICROSCOPIC
Glucose, UA: NEGATIVE mg/dL
Ketones, ur: 40 mg/dL — AB
Leukocytes, UA: NEGATIVE
Nitrite: NEGATIVE
Protein, ur: >300 mg/dL — AB
pH: 6 (ref 5.0–8.0)

## 2017-01-21 LAB — URINALYSIS, MICROSCOPIC (REFLEX)

## 2017-01-21 LAB — HCG, QUANTITATIVE, PREGNANCY: hCG, Beta Chain, Quant, S: 1 m[IU]/mL (ref <5)

## 2017-01-21 LAB — PREGNANCY, URINE: PREG TEST UR: POSITIVE — AB

## 2017-01-21 MED ORDER — SODIUM CHLORIDE 0.9 % IV BOLUS (SEPSIS)
1000.0000 mL | Freq: Once | INTRAVENOUS | Status: AC
  Administered 2017-01-21: 1000 mL via INTRAVENOUS

## 2017-01-21 MED ORDER — ONDANSETRON HCL 4 MG/2ML IJ SOLN
4.0000 mg | Freq: Once | INTRAMUSCULAR | Status: DC
  Filled 2017-01-21: qty 2

## 2017-01-21 MED ORDER — METOCLOPRAMIDE HCL 5 MG/ML IJ SOLN
10.0000 mg | Freq: Once | INTRAMUSCULAR | Status: AC
  Administered 2017-01-21: 10 mg via INTRAVENOUS
  Filled 2017-01-21: qty 2

## 2017-01-21 MED ORDER — KETOROLAC TROMETHAMINE 15 MG/ML IJ SOLN
15.0000 mg | Freq: Once | INTRAMUSCULAR | Status: AC
  Administered 2017-01-21: 15 mg via INTRAVENOUS
  Filled 2017-01-21: qty 1

## 2017-01-21 MED ORDER — DIPHENHYDRAMINE HCL 50 MG/ML IJ SOLN
25.0000 mg | Freq: Once | INTRAMUSCULAR | Status: AC
  Administered 2017-01-21: 25 mg via INTRAVENOUS
  Filled 2017-01-21: qty 1

## 2017-01-21 MED ORDER — LORAZEPAM 2 MG/ML IJ SOLN
1.0000 mg | Freq: Once | INTRAMUSCULAR | Status: AC
  Administered 2017-01-21: 1 mg via INTRAVENOUS
  Filled 2017-01-21: qty 1

## 2017-01-21 MED ORDER — PROMETHAZINE HCL 25 MG/ML IJ SOLN
25.0000 mg | Freq: Once | INTRAMUSCULAR | Status: AC
  Administered 2017-01-21: 25 mg via INTRAVENOUS
  Filled 2017-01-21: qty 1

## 2017-01-21 MED ORDER — ONDANSETRON HCL 4 MG/2ML IJ SOLN
4.0000 mg | Freq: Once | INTRAMUSCULAR | Status: AC
  Administered 2017-01-21: 4 mg via INTRAVENOUS
  Filled 2017-01-21: qty 2

## 2017-01-21 MED ORDER — SODIUM CHLORIDE 0.9 % IV SOLN
Freq: Once | INTRAVENOUS | Status: AC
  Administered 2017-01-21: 1000 mL via INTRAVENOUS

## 2017-01-21 MED ORDER — LORAZEPAM 1 MG PO TABS: 1.0000 mg | ORAL_TABLET | Freq: Three times a day (TID) | ORAL | 0 refills | Status: AC | PRN

## 2017-01-21 MED ORDER — PROMETHAZINE HCL 25 MG RE SUPP: 25.0000 mg | Freq: Four times a day (QID) | RECTAL | 0 refills | Status: AC | PRN

## 2017-01-21 NOTE — ED Notes (Signed)
Pt states still has a little nausea but not as bad as it was

## 2017-01-21 NOTE — ED Triage Notes (Signed)
Pt c/o vomiting x 1 day 

## 2017-01-21 NOTE — ED Notes (Signed)
Pt c/o ha  Given Toradol for pain

## 2017-01-21 NOTE — ED Notes (Signed)
C/o n/v since 0700 yesterday am w some diarrhea,  Had some sharp abd pain  Which is not as bad now

## 2017-01-21 NOTE — ED Provider Notes (Signed)
[facility] Provider Note: Julia Dell, MD, FACEP  CSN: [id number] MRN: [id number] ARRIVAL: 01/21/17 at 0041 ROOM: [facility]06/[facility]06   CHIEF COMPLAINT  Vomiting   HISTORY OF PRESENT ILLNESS  01/21/17 12:55 AM Julia Baldwin is a 35 y.o. female who has been on Zoloft for postpartum depression. She recently had her dose increased. She is here with nausea and vomiting since about 7:00 yesterday morning. Her vomiting has been unrelenting and attempts to eat or drink have been unsuccessful. She has not kept down any medications for this. She has had some mild diarrhea but nothing profuse. She had some fairly severe left upper quadrant abdominal pain earlier which has resolved. She now has some mild diffuse abdominal pain, worse with palpation or vomiting. She is not aware of having a fever. She does have a headache which she rates as a 5 out of 10. She also had sweating earlier but none presently.   Past Medical History:  Diagnosis Date  . Anxiety   . Depression, postpartum   . Hypertension     Past Surgical History:  Procedure Laterality Date  . CESAREAN SECTION    . 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  Social History  Substance Use Topics  . Smoking status: Never Smoker  . Smokeless tobacco: Not on file  . Alcohol use No    Prior to Admission medications   Medication Sig Start Date End Date Taking? Authorizing Provider  amLODipine (NORVASC) 5 MG tablet Take 1 tablet (5 mg total) by mouth daily. 12/16/16 01/15/17  Lenox Ponds, MD  docusate sodium (COLACE) 100 MG capsule Take 100 mg by mouth daily as needed.    [provider]  labetalol (NORMODYNE) 100 MG tablet Take 1 tablet (100 mg total) by mouth 2 (two) times daily. 12/15/16   Lenox Ponds, MD  LORazepam (ATIVAN) 1 MG tablet Take 1 mg by mouth every 8 (eight) hours as needed for anxiety.    [provider]  omeprazole  (PRILOSEC) 20 MG capsule Take 20 mg by mouth daily as needed.    [provider]  ondansetron (ZOFRAN ODT) 4 MG disintegrating tablet Take 1 tablet (4 mg total) by mouth every 8 (eight) hours as needed for nausea or vomiting. 12/11/16   Shaune Pollack, MD  Prenatal Vit-Fe Fumarate-FA (PRENATAL MULTIVITAMIN) TABS tablet Take 1 tablet by mouth daily at 12 noon.    [provider]  promethazine (PHENERGAN) 25 MG suppository Place 1 suppository (25 mg total) rectally every 6 (six) hours as needed for refractory nausea / vomiting. 12/11/16   Shaune Pollack, MD  sertraline (ZOLOFT) 50 MG tablet Take 100 mg by mouth daily.     [provider]    Allergies Patient has no known allergies.   REVIEW OF SYSTEMS  Negative except as noted here or in the History of Present Illness.   PHYSICAL EXAMINATION  Initial Vital Signs Blood pressure (!) 164/106, pulse 92, temperature 99.4 F (37.4 C), resp. rate 18, height 5\' 2"  (1.575 m), weight 43.1 kg (95 lb), SpO2 100 %.  Examination General: Well-developed, thin female in no acute distress; appearance consistent with age of record HENT: normocephalic; atraumatic Eyes: pupils equal, round and reactive to light; extraocular muscles intact Neck: supple Heart: regular rate and rhythm Lungs: clear to auscultation bilaterally Abdomen: soft; nondistended; mild diffuse tenderness; no masses or hepatosplenomegaly; bowel sounds present Extremities: No deformity;  full range of motion; pulses normal Neurologic: Awake, alert and oriented; motor function intact in all extremities and symmetric; no facial droop Skin: Warm and dry Psychiatric: Normal mood and affect   RESULTS  Summary of this visit's results, reviewed by myself:   EKG Interpretation  Date/Time:    Ventricular Rate:    PR Interval:    QRS Duration:   QT Interval:    QTC Calculation:   R Axis:     Text Interpretation:        Laboratory Studies: Results for  orders placed or performed during the hospital encounter of 01/21/17 (from the past 24 hour(s))  Urinalysis, Routine w reflex microscopic     Status: Abnormal   Collection Time: 01/21/17 12:51 AM  Result Value Ref Range   Color, Urine YELLOW YELLOW   APPearance CLOUDY (A) CLEAR   Specific Gravity, Urine >1.030 (H) 1.005 - 1.030   pH 6.0 5.0 - 8.0   Glucose, UA NEGATIVE NEGATIVE mg/dL   Hgb urine dipstick MODERATE (A) NEGATIVE   Bilirubin Urine SMALL (A) NEGATIVE   Ketones, ur 40 (A) NEGATIVE mg/dL   Protein, ur >161 (A) NEGATIVE mg/dL   Nitrite NEGATIVE NEGATIVE   Leukocytes, UA NEGATIVE NEGATIVE  Pregnancy, urine     Status: Abnormal   Collection Time: 01/21/17 12:51 AM  Result Value Ref Range   Preg Test, Ur POSITIVE (A) NEGATIVE  Urinalysis, Microscopic (reflex)     Status: Abnormal   Collection Time: 01/21/17 12:51 AM  Result Value Ref Range   RBC / HPF 6-30 0 - 5 RBC/hpf   WBC, UA 6-30 0 - 5 WBC/hpf   Bacteria, UA FEW (A) NONE SEEN   Squamous Epithelial / LPF 6-30 (A) NONE SEEN   Mucus PRESENT   CBC with Differential/Platelet     Status: None   Collection Time: 01/21/17  1:25 AM  Result Value Ref Range   WBC 8.8 4.0 - 10.5 K/uL   RBC 4.49 3.87 - 5.11 MIL/uL   Hemoglobin 14.0 12.0 - 15.0 g/dL   HCT 09.6 04.5 - 40.9 %   MCV 94.7 78.0 - 100.0 fL   MCH 31.2 26.0 - 34.0 pg   MCHC 32.9 30.0 - 36.0 g/dL   RDW 81.1 91.4 - 78.2 %   Platelets 219 150 - 400 K/uL   Neutrophils Relative % 81 %   Neutro Abs 7.1 1.7 - 7.7 K/uL   Lymphocytes Relative 11 %   Lymphs Abs 0.9 0.7 - 4.0 K/uL   Monocytes Relative 8 %   Monocytes Absolute 0.7 0.1 - 1.0 K/uL   Eosinophils Relative 0 %   Eosinophils Absolute 0.0 0.0 - 0.7 K/uL   Basophils Relative 0 %   Basophils Absolute 0.0 0.0 - 0.1 K/uL  Comprehensive metabolic panel     Status: Abnormal   Collection Time: 01/21/17  2:00 AM  Result Value Ref Range   Sodium 138 135 - 145 mmol/L   Potassium 3.5 3.5 - 5.1 mmol/L   Chloride 100 (L)  101 - 111 mmol/L   CO2 24 22 - 32 mmol/L   Glucose, Bld 123 (H) 65 - 99 mg/dL   BUN 19 6 - 20 mg/dL   Creatinine, Ser 9.56 0.44 - 1.00 mg/dL   Calcium 9.5 8.9 - 21.3 mg/dL   Total Protein 8.2 (H) 6.5 - 8.1 g/dL   Albumin 5.0 3.5 - 5.0 g/dL   AST 39 15 - 41 U/L   ALT 44 14 -  54 U/L   Alkaline Phosphatase 88 38 - 126 U/L   Total Bilirubin 0.8 0.3 - 1.2 mg/dL   GFR calc non Af Amer >60 >60 mL/min   GFR calc Af Amer >60 >60 mL/min   Anion gap 14 5 - 15  hCG, quantitative, pregnancy     Status: None   Collection Time: 01/21/17  2:00 AM  Result Value Ref Range   hCG, Beta Chain, Quant, S 1 <5 mIU/mL   Imaging Studies: No results found.  ED COURSE  Nursing notes and initial vitals signs, including pulse oximetry, reviewed.  Vitals:   01/21/17 0046 01/21/17 0048 01/21/17 0328  BP:  (!) 164/106 (!) 159/88  Pulse:  92 84  Resp:  18 18  Temp:  99.4 F (37.4 C)   SpO2:  100% 98%  Weight: 43.1 kg (95 lb)    Height: 5\' 2"  (1.575 m)     3:55 AM Headache improved after 2 liter normal saline bolus and IV Toradol with patient still complains of nausea and vomiting despite IV Reglan, Benadryl and Zofran. We'll try Ativan.  5:38 AM Feeling better. Headache almost completely resolved. Able to drink fluids without emesis.  PROCEDURES    ED DIAGNOSES     ICD-10-CM   1. Nausea and vomiting in adult R11.2        Paloma Grange, Julia RuizJohn, MD 01/21/17 [phone]

## 2017-01-25 ENCOUNTER — Emergency Department (HOSPITAL_BASED_OUTPATIENT_CLINIC_OR_DEPARTMENT_OTHER)
Admission: EM | Admit: 2017-01-25 | Discharge: 2017-01-25 | Disposition: A | Discharge status: Home / Self Care | Payer: BLUE CROSS/BLUE SHIELD | Attending: Emergency Medicine | Admitting: Emergency Medicine

## 2017-01-25 ENCOUNTER — Encounter (HOSPITAL_BASED_OUTPATIENT_CLINIC_OR_DEPARTMENT_OTHER): Payer: Self-pay

## 2017-01-25 DIAGNOSIS — I10 Essential (primary) hypertension: Secondary | ICD-10-CM | POA: Diagnosis not present

## 2017-01-25 DIAGNOSIS — Z79899 Other long term (current) drug therapy: Secondary | ICD-10-CM | POA: Diagnosis not present

## 2017-01-25 DIAGNOSIS — E876 Hypokalemia: Secondary | ICD-10-CM | POA: Diagnosis not present

## 2017-01-25 DIAGNOSIS — R112 Nausea with vomiting, unspecified: Secondary | ICD-10-CM

## 2017-01-25 LAB — COMPREHENSIVE METABOLIC PANEL
ALK PHOS: 68 U/L (ref 38–126)
ALT: 29 U/L (ref 14–54)
AST: 16 U/L (ref 15–41)
Albumin: 4.5 g/dL (ref 3.5–5.0)
Anion gap: 12 (ref 5–15)
BILIRUBIN TOTAL: 0.7 mg/dL (ref 0.3–1.2)
BUN: 18 mg/dL (ref 6–20)
CALCIUM: 9.4 mg/dL (ref 8.9–10.3)
CHLORIDE: 93 mmol/L — AB (ref 101–111)
CO2: 27 mmol/L (ref 22–32)
CREATININE: 0.93 mg/dL (ref 0.44–1.00)
Glucose, Bld: 112 mg/dL — ABNORMAL HIGH (ref 65–99)
Potassium: 2.8 mmol/L — ABNORMAL LOW (ref 3.5–5.1)
Sodium: 132 mmol/L — ABNORMAL LOW (ref 135–145)
Total Protein: 7.5 g/dL (ref 6.5–8.1)

## 2017-01-25 LAB — URINALYSIS, ROUTINE W REFLEX MICROSCOPIC
GLUCOSE, UA: NEGATIVE mg/dL
Hgb urine dipstick: NEGATIVE
Ketones, ur: 15 mg/dL — AB
Nitrite: NEGATIVE
PH: 6 (ref 5.0–8.0)
Protein, ur: 100 mg/dL — AB
SPECIFIC GRAVITY, URINE: 1.02 (ref 1.005–1.030)

## 2017-01-25 LAB — CBC WITH DIFFERENTIAL/PLATELET
BASOS PCT: 0 %
Basophils Absolute: 0 10*3/uL (ref 0.0–0.1)
EOS ABS: 0 10*3/uL (ref 0.0–0.7)
EOS PCT: 0 %
HCT: 40.5 % (ref 36.0–46.0)
HEMOGLOBIN: 13.7 g/dL (ref 12.0–15.0)
LYMPHS ABS: 1.2 10*3/uL (ref 0.7–4.0)
Lymphocytes Relative: 17 %
MCH: 31.2 pg (ref 26.0–34.0)
MCHC: 33.8 g/dL (ref 30.0–36.0)
MCV: 92.3 fL (ref 78.0–100.0)
Monocytes Absolute: 0.8 10*3/uL (ref 0.1–1.0)
Monocytes Relative: 12 %
NEUTROS PCT: 71 %
Neutro Abs: 5.1 10*3/uL (ref 1.7–7.7)
PLATELETS: 209 10*3/uL (ref 150–400)
RBC: 4.39 MIL/uL (ref 3.87–5.11)
RDW: 14 % (ref 11.5–15.5)
WBC: 7.2 10*3/uL (ref 4.0–10.5)

## 2017-01-25 LAB — PREGNANCY, URINE: Preg Test, Ur: NEGATIVE

## 2017-01-25 LAB — URINALYSIS, MICROSCOPIC (REFLEX): RBC / HPF: NONE SEEN RBC/hpf (ref 0–5)

## 2017-01-25 LAB — LIPASE, BLOOD: LIPASE: 24 U/L (ref 11–51)

## 2017-01-25 MED ORDER — ONDANSETRON HCL 4 MG/2ML IJ SOLN: 4.0000 mg | Freq: Once | INTRAMUSCULAR | Status: DC

## 2017-01-25 MED ORDER — SODIUM CHLORIDE 0.9 % IV BOLUS (SEPSIS)
1000.0000 mL | Freq: Once | INTRAVENOUS | Status: AC
  Administered 2017-01-25: 1000 mL via INTRAVENOUS

## 2017-01-25 MED ORDER — METOCLOPRAMIDE HCL 5 MG/ML IJ SOLN: 10.0000 mg | Freq: Once | INTRAMUSCULAR | Status: DC

## 2017-01-25 MED ORDER — POTASSIUM CHLORIDE ER 20 MEQ PO TBCR: 20.0000 meq | EXTENDED_RELEASE_TABLET | Freq: Every day | ORAL | 0 refills | Status: AC

## 2017-01-25 MED ORDER — PROMETHAZINE HCL 25 MG/ML IJ SOLN
12.5000 mg | Freq: Once | INTRAMUSCULAR | Status: AC
  Administered 2017-01-25: 12.5 mg via INTRAVENOUS
  Filled 2017-01-25: qty 1

## 2017-01-25 MED FILL — POTASSIUM CL ER 20 MEQ TABL: 20 | 7 days supply | Qty: 7 | Fill #0

## 2017-01-25 NOTE — ED Notes (Signed)
Pt educated about not driving or performing other critical tasks (such as operating heavy machinery, caring for infant/toddler/child) due to sedative nature of medications received in ED. Also warned about risks of consuming alcohol or taking other medications with sedative properties. Pt/caregiver verbalized understanding. Pt texting her husband to get a ride.

## 2017-01-25 NOTE — ED Provider Notes (Signed)
[facility] Provider Note   CSN: [id number] Arrival date & time: 01/25/17  1205     History   Chief Complaint Chief Complaint  Patient presents with  . Vomiting    HPI Julia Baldwin is a 36 y.o. female.  Pt presents to the ED today with n/v.  She has had this problem intermittently since she delivered her newborn 3 months ago.  The pt came to the ED on 8/31 and felt better after IVFs.  She said she had intermittent vomiting all weekend.  She went to see her pcp today who is trying to get pt into GI.  The pt had an outpatient gb US and abd [place] today at [facility].  The pt does not yet know the results.  The pt has been under a lot of stress.  She has a new born with some post partum depression.  She has a 38 year old as well.  Her husband is supportive, but is worried about patient, which is making her more stressed.  Her pcp gave her a rx for reglan and zantac which she has not yet picked up as it was given to her this morning.       Past Medical History:  Diagnosis Date  . Anxiety   . Depression, postpartum   . Hypertension     Patient Active Problem List   Diagnosis Date Noted  . Intractable nausea and vomiting 12/12/2016  . UTI (urinary tract infection) 12/12/2016  . Essential hypertension 12/12/2016  . Hyperglycemia 12/12/2016    Past Surgical History:  Procedure Laterality Date  . CESAREAN SECTION    . LEG SURGERY    . OTHER SURGICAL HISTORY Right    ORIF with hardware to tib/fib    OB History    No data available       Home Medications    Prior to Admission medications   Medication Sig Start Date End Date Taking? Authorizing Provider  docusate sodium (COLACE) 100 MG capsule Take 100 mg by mouth daily as needed.    [provider]  labetalol (NORMODYNE) 100 MG tablet Take 1 tablet (100 mg total) by mouth 2 (two) times daily. 12/15/16   Lenox Ponds, MD  LORazepam (ATIVAN) 1 MG tablet Take 1 tablet (1 mg total) by mouth  every 8 (eight) hours as needed (for nausea/vomiting). 01/21/17   Molpus, John, MD  omeprazole (PRILOSEC) 20 MG capsule Take 20 mg by mouth daily as needed.    [provider]  ondansetron (ZOFRAN ODT) 4 MG disintegrating tablet Take 1 tablet (4 mg total) by mouth every 8 (eight) hours as needed for nausea or vomiting. 12/11/16   Shaune Pollack, MD  potassium chloride 20 MEQ TBCR Take 20 mEq by mouth daily. 01/25/17   Jacalyn Lefevre, MD  Prenatal Vit-Fe Fumarate-FA (PRENATAL MULTIVITAMIN) TABS tablet Take 1 tablet by mouth daily at 12 noon.    [provider]  promethazine (PHENERGAN) 25 MG suppository Place 1 suppository (25 mg total) rectally every 6 (six) hours as needed for nausea or vomiting. 01/21/17   Molpus, John, MD  sertraline (ZOLOFT) 50 MG tablet Take 100 mg by mouth daily.     [provider]    Family History No family history on file.  Social History Social History  Substance Use Topics  . Smoking status: Never Smoker  . Smokeless tobacco: Never Used  . Alcohol use No     Allergies   Patient has no known  allergies.   Review of Systems Review of Systems  Gastrointestinal: Positive for abdominal pain, nausea and vomiting.  All other systems reviewed and are negative.    Physical Exam Updated Vital Signs BP (!) 190/111 (BP Location: Left Arm)   Pulse 80   Temp 99.9 F (37.7 C) (Oral)   Resp 18   Ht 5\' 2"  (1.575 m)   Wt 39.9 kg (88 lb)   LMP  (LMP Unknown)   SpO2 100%   BMI 16.10 kg/m   Physical Exam  Constitutional: She is oriented to person, place, and time. She appears well-developed and well-nourished.  HENT:  Head: Normocephalic and atraumatic.  Right Ear: External ear normal.  Left Ear: External ear normal.  Nose: Nose normal.  Mouth/Throat: Mucous membranes are dry.  Eyes: Pupils are equal, round, and reactive to light. Conjunctivae and EOM are normal.  Neck: Normal range of motion. Neck supple.  Cardiovascular: Normal  rate, regular rhythm, normal heart sounds and intact distal pulses.   Pulmonary/Chest: Effort normal and breath sounds normal.  Abdominal: Soft. Bowel sounds are normal.  Musculoskeletal: Normal range of motion.  Neurological: She is alert and oriented to person, place, and time.  Skin: Skin is warm.  Psychiatric: She has a normal mood and affect. Her behavior is normal. Judgment and thought content normal.  Nursing note and vitals reviewed.    ED Treatments / Results  Labs (all labs ordered are listed, but only abnormal results are displayed) Labs Reviewed  URINALYSIS, ROUTINE W REFLEX MICROSCOPIC - Abnormal; Notable for the following:       Result Value   Bilirubin Urine SMALL (*)    Ketones, ur 15 (*)    Protein, ur 100 (*)    Leukocytes, UA TRACE (*)    All other components within normal limits  COMPREHENSIVE METABOLIC PANEL - Abnormal; Notable for the following:    Sodium 132 (*)    Potassium 2.8 (*)    Chloride 93 (*)    Glucose, Bld 112 (*)    All other components within normal limits  URINALYSIS, MICROSCOPIC (REFLEX) - Abnormal; Notable for the following:    Bacteria, UA FEW (*)    Squamous Epithelial / LPF 0-5 (*)    All other components within normal limits  PREGNANCY, URINE  CBC WITH DIFFERENTIAL/PLATELET  LIPASE, BLOOD    EKG  EKG Interpretation None       Radiology No results found.  Procedures Procedures (including critical care time)  Medications Ordered in ED Medications  metoCLOPramide (REGLAN) injection 10 mg (10 mg Intravenous Not Given 01/25/17 1312)  sodium chloride 0.9 % bolus 1,000 mL (0 mLs Intravenous Stopped 01/25/17 1439)  promethazine (PHENERGAN) injection 12.5 mg (12.5 mg Intravenous Given 01/25/17 1314)     Initial Impression / Assessment and Plan / ED Course  I have reviewed the triage vital signs and the nursing notes.  Pertinent labs & imaging results that were available during my care of the patient were reviewed by me and  considered in my medical decision making (see chart for details).    [facility] is in the middle of a merge with [facility], so films are not on PACS, but I got a verbal report of negative from the [place] tech.  Pt is feeling better.  I did offer admission as she has had recurrent n/v and hypokalemia.  Due to the newborn, she wants to go home.  Her pcp is working on a gi referral.  Pt knows to  return if worse.   Final Clinical Impressions(s) / ED Diagnoses   Final diagnoses:  Hypokalemia  Non-intractable vomiting with nausea, unspecified vomiting type    New Prescriptions New Prescriptions   POTASSIUM CHLORIDE 20 MEQ TBCR    Take 20 mEq by mouth daily.     Jacalyn LefevreHaviland, Ezrael Sam, MD 01/25/17 [id number]

## 2017-01-25 NOTE — ED Triage Notes (Signed)
C/o vomiting x 3 months-seen here for same last week-was sent from PCP office today-NAD-steady gait

## 2017-01-25 NOTE — Discharge Instructions (Signed)
Take reglan and zantac that your doctor prescribed.  F/u with GI.

## 2018-04-19 IMAGING — DX DG ABDOMEN 1V
1 series · 1 of 1 positions shown · non-contrast
Comparison: None

CLINICAL DATA: Nausea and vomiting.

EXAM:
ABDOMEN - 1 VIEW

[abdomen kub]
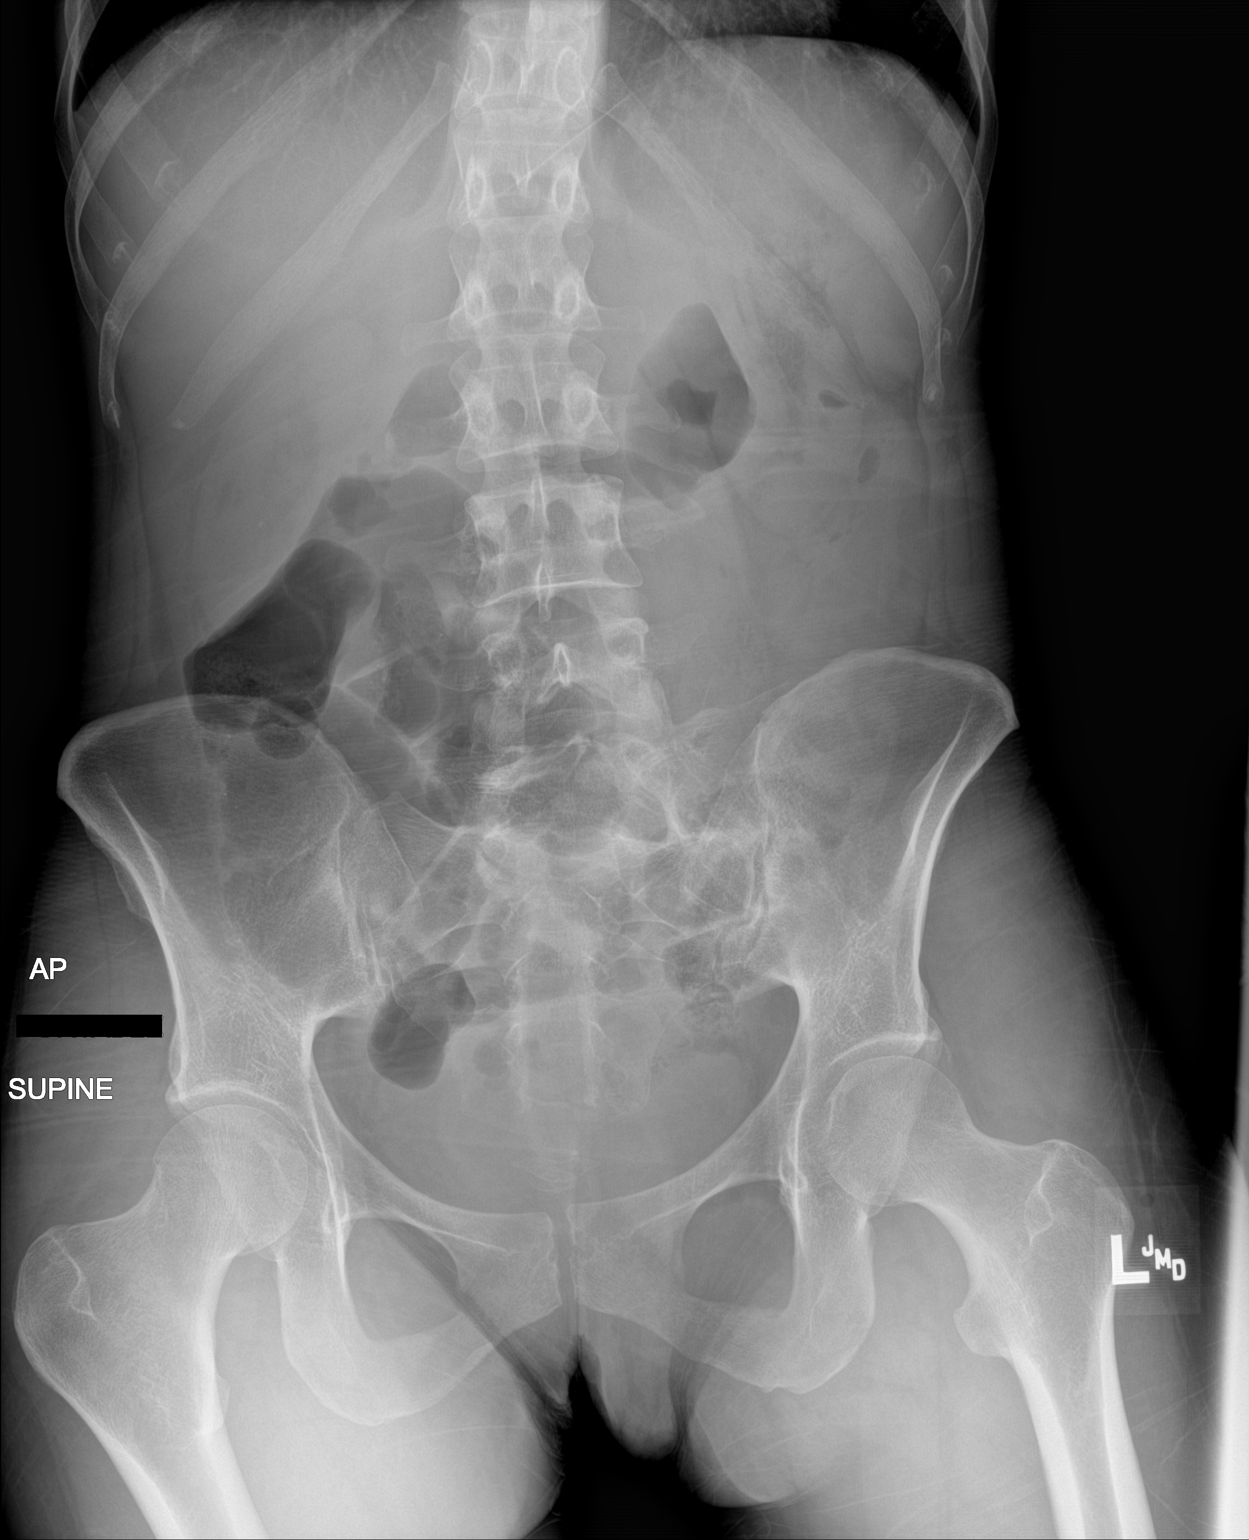

[1 of 1 positions shown; findings below may reference images not displayed]

FINDINGS: The bowel gas pattern is normal. Small right renal calculus is
identified, similar to previous exam.
IMPRESSION: 1. Nonobstructive bowel gas pattern.
2. Right renal calculus.

## 2018-04-19 IMAGING — CT CT HEAD W/O CM
3 series · 15 of 45 positions shown, 18 images · non-contrast
Comparison: None.

CLINICAL DATA: Vomiting and headaches for several days, initial
encounter

EXAM:
CT HEAD WITHOUT CONTRAST
TECHNIQUE: Contiguous axial images were obtained from the base of the skull
through the vertex without intravenous contrast.

[Series 2: head wo · axial · 0.47mm/px · z∈[-118,-3]mm · 9 of 28 slices shown, 12 images]
[im 3/28  brain]
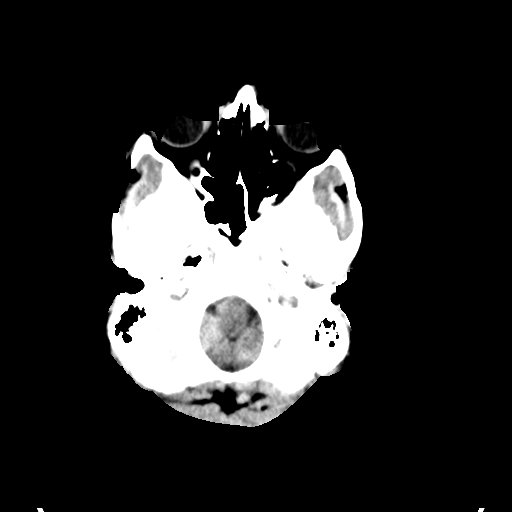
[im 3/28  bone]
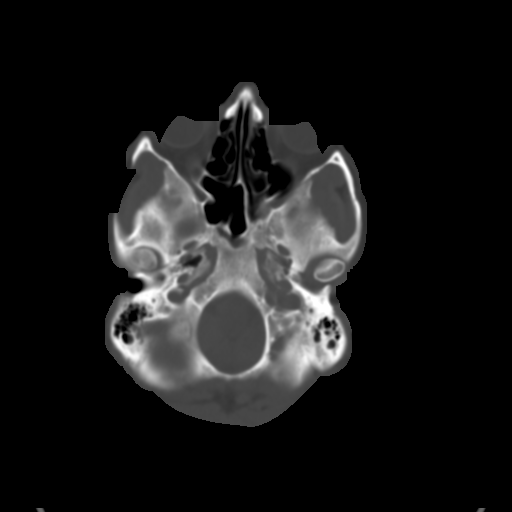
[im 6/28  brain]
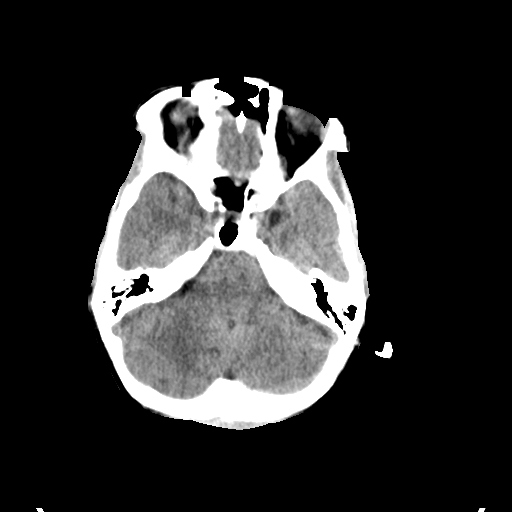
[im 9/28  brain]
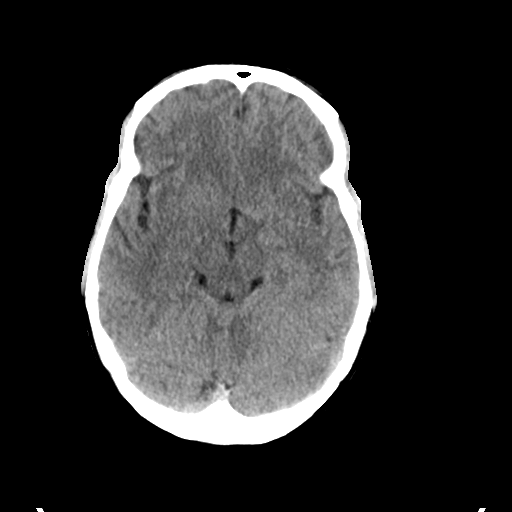
[im 12/28  brain]
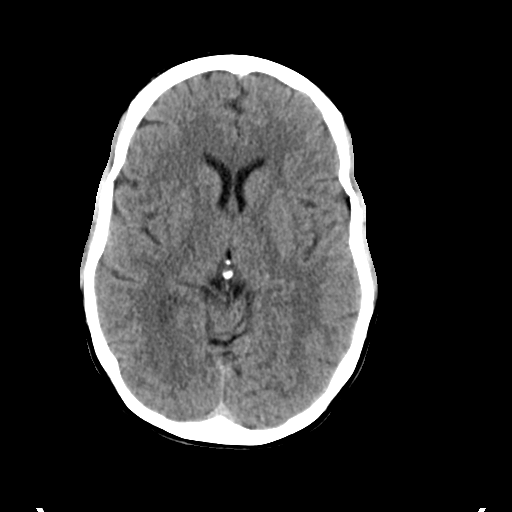
[im 15/28  brain]
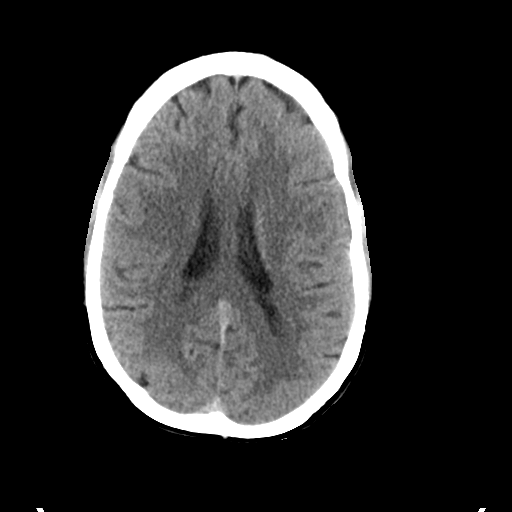
[im 15/28  bone]
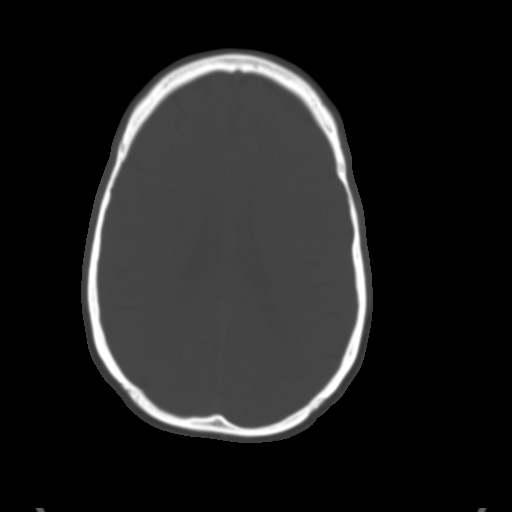
[im 17/28  brain]
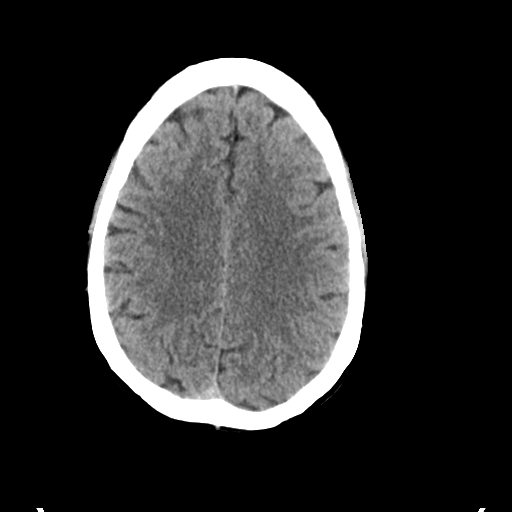
[im 20/28  brain]
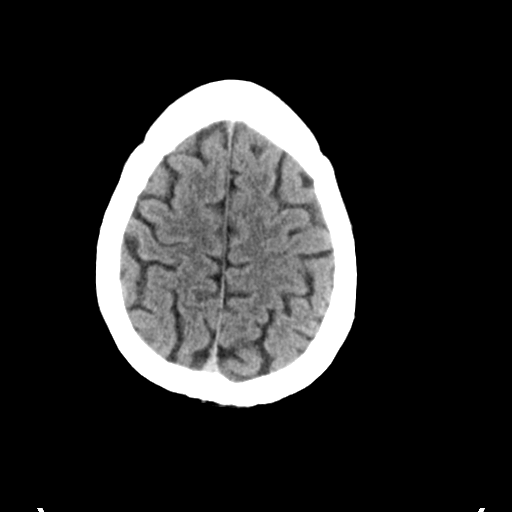
[im 23/28  brain]
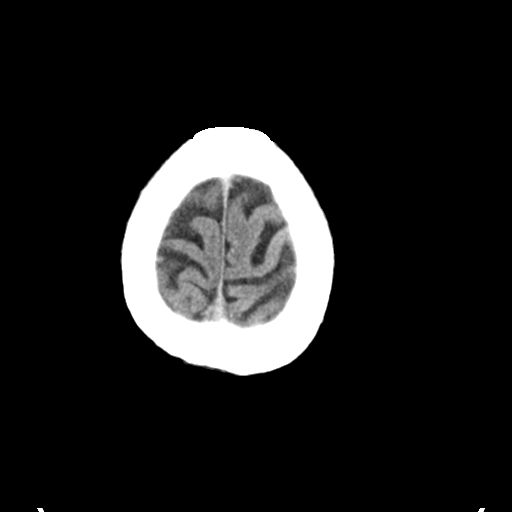
[im 26/28  brain]
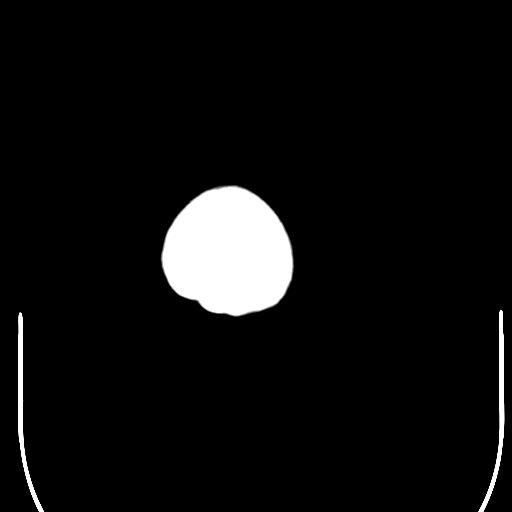
[im 26/28  bone]
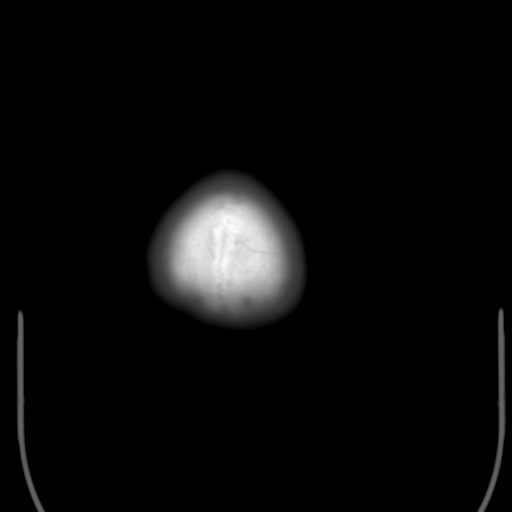

[Series 4: coronal soft tissue · coronal · 0.28mm/px · 3 of 66 slices shown]
[im 22/66  brain]
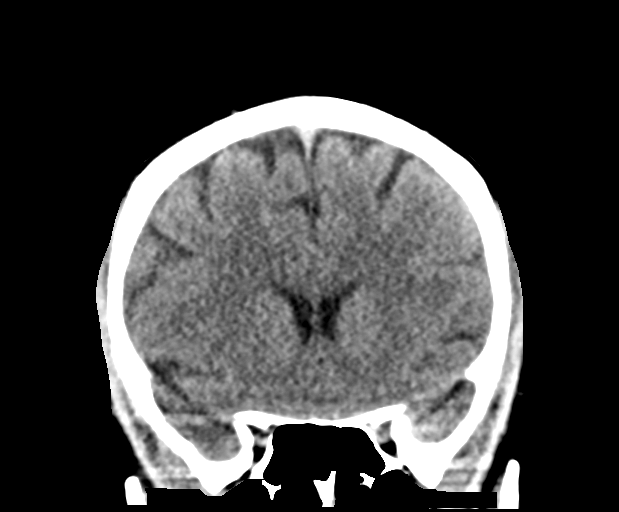
[im 29/66  brain]
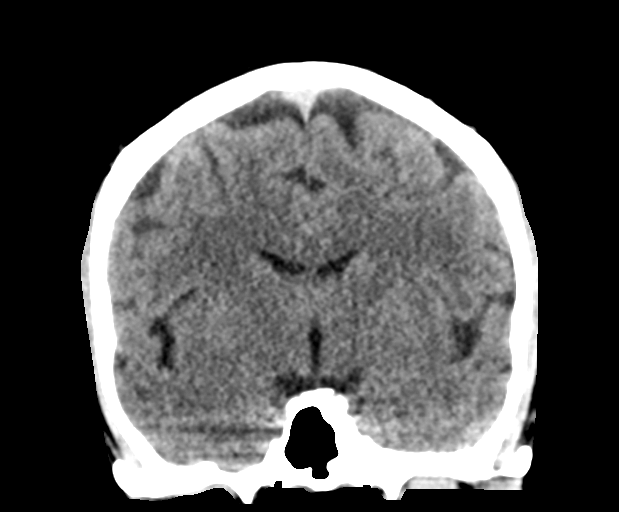
[im 37/66  brain]
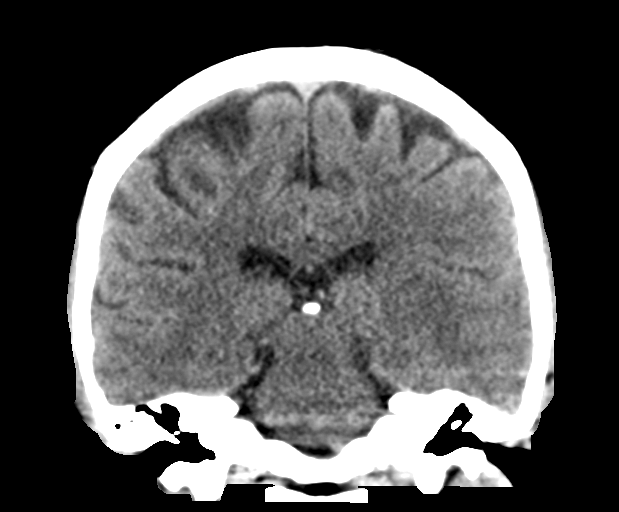

[Series 5: sagittal soft tissue · sagittal · 0.28mm/px · 3 of 58 slices shown]
[im 20/58  brain]
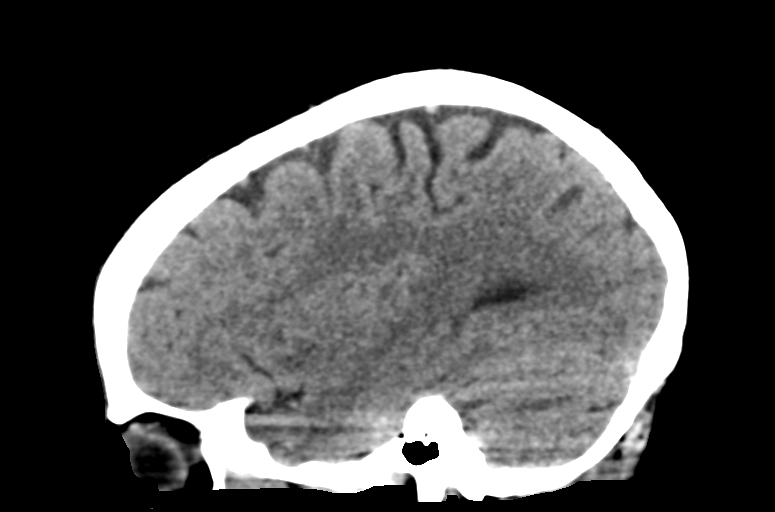
[im 29/58  brain]
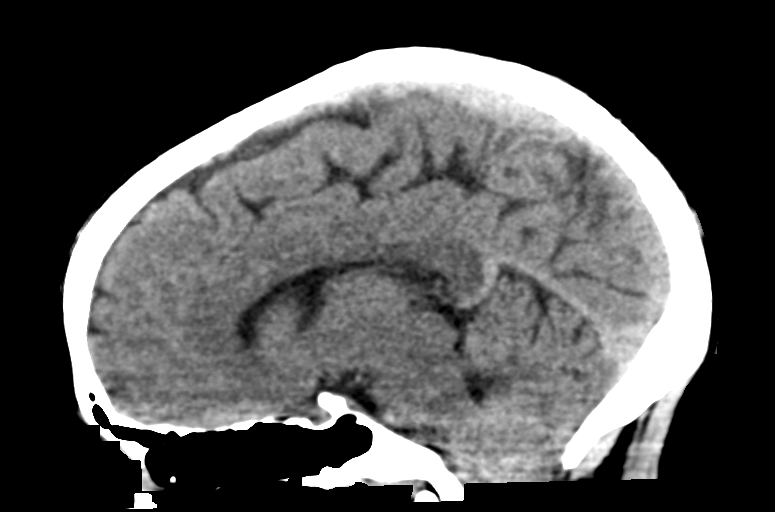
[im 39/58  brain]
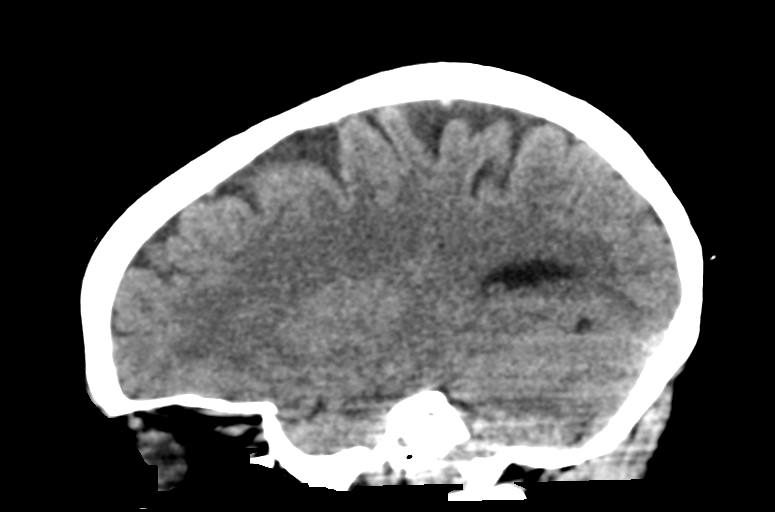

[15 of 45 positions shown; findings below may reference images not displayed]

FINDINGS: Brain: No evidence of acute infarction, hemorrhage, hydrocephalus,
extra-axial collection or mass lesion/mass effect.

Vascular: No hyperdense vessel or unexpected calcification.

Skull: Normal. Negative for fracture or focal lesion.

Sinuses/Orbits: No acute finding.

Other: None.
IMPRESSION: No acute intracranial abnormality noted.
